# Patient Record
Sex: Female | Born: 1975 | Race: Black or African American | Hispanic: No | Marital: Single | Smoking: Never smoker
Health system: Southern US, Community
[De-identification: ages and names within clinical notes are randomized; demographics above are authoritative.]

---

## 1998-06-11 ENCOUNTER — Emergency Department (HOSPITAL_COMMUNITY): Admission: EM | Admit: 1998-06-11 | Discharge: 1998-06-11 | Payer: Self-pay

## 1998-12-20 ENCOUNTER — Emergency Department (HOSPITAL_COMMUNITY): Admission: EM | Admit: 1998-12-20 | Discharge: 1998-12-20 | Payer: Self-pay | Admitting: Emergency Medicine

## 1998-12-20 ENCOUNTER — Encounter: Payer: Self-pay | Admitting: Emergency Medicine

## 2003-07-06 ENCOUNTER — Other Ambulatory Visit: Admission: RE | Admit: 2003-07-06 | Discharge: 2003-07-06 | Payer: Self-pay | Admitting: *Deleted

## 2004-01-08 ENCOUNTER — Emergency Department (HOSPITAL_COMMUNITY): Admission: EM | Admit: 2004-01-08 | Discharge: 2004-01-08 | Payer: Self-pay | Admitting: Emergency Medicine

## 2008-02-17 ENCOUNTER — Ambulatory Visit: Payer: Self-pay | Admitting: Cardiology

## 2008-02-27 ENCOUNTER — Ambulatory Visit: Payer: Self-pay

## 2008-02-27 ENCOUNTER — Encounter: Payer: Self-pay | Admitting: Cardiology

## 2008-04-06 ENCOUNTER — Ambulatory Visit: Payer: Self-pay | Admitting: Gastroenterology

## 2008-04-06 DIAGNOSIS — R079 Chest pain, unspecified: Secondary | ICD-10-CM

## 2008-04-06 DIAGNOSIS — R1013 Epigastric pain: Secondary | ICD-10-CM

## 2008-04-06 LAB — CONVERTED CEMR LAB

## 2008-04-07 LAB — CONVERTED CEMR LAB
ALT: 17 units/L (ref 0–35)
AST: 17 units/L (ref 0–37)
Albumin: 3.9 g/dL (ref 3.5–5.2)
Alkaline Phosphatase: 40 units/L (ref 39–117)
BUN: 15 mg/dL (ref 6–23)
Basophils Absolute: 0 10*3/uL (ref 0.0–0.1)
Basophils Relative: 0.2 % (ref 0.0–3.0)
CO2: 29 meq/L (ref 19–32)
Calcium: 9.5 mg/dL (ref 8.4–10.5)
Chloride: 105 meq/L (ref 96–112)
Creatinine, Ser: 0.9 mg/dL (ref 0.4–1.2)
Eosinophils Absolute: 0 10*3/uL (ref 0.0–0.7)
Eosinophils Relative: 0.9 % (ref 0.0–5.0)
GFR calc Af Amer: 95 mL/min
GFR calc non Af Amer: 79 mL/min
Glucose, Bld: 78 mg/dL (ref 70–99)
HCT: 40.7 % (ref 36.0–46.0)
Hemoglobin: 13.8 g/dL (ref 12.0–15.0)
Lymphocytes Relative: 26.1 % (ref 12.0–46.0)
MCHC: 34 g/dL (ref 30.0–36.0)
MCV: 85.3 fL (ref 78.0–100.0)
Monocytes Absolute: 0.3 10*3/uL (ref 0.1–1.0)
Monocytes Relative: 6.9 % (ref 3.0–12.0)
Neutro Abs: 3 10*3/uL (ref 1.4–7.7)
Neutrophils Relative %: 65.9 % (ref 43.0–77.0)
Platelets: 137 10*3/uL — ABNORMAL LOW (ref 150–400)
Potassium: 4.4 meq/L (ref 3.5–5.1)
RBC: 4.77 M/uL (ref 3.87–5.11)
RDW: 12.2 % (ref 11.5–14.6)
Sodium: 139 meq/L (ref 135–145)
TSH: 0.98 microintl units/mL (ref 0.35–5.50)
Total Bilirubin: 0.7 mg/dL (ref 0.3–1.2)
Total Protein: 7.3 g/dL (ref 6.0–8.3)
WBC: 4.4 10*3/uL — ABNORMAL LOW (ref 4.5–10.5)

## 2008-04-13 ENCOUNTER — Ambulatory Visit: Payer: Self-pay | Admitting: Gastroenterology

## 2008-04-13 ENCOUNTER — Ambulatory Visit (HOSPITAL_COMMUNITY): Admission: RE | Admit: 2008-04-13 | Discharge: 2008-04-13 | Payer: Self-pay | Admitting: Gastroenterology

## 2008-04-13 ENCOUNTER — Encounter: Payer: Self-pay | Admitting: Gastroenterology

## 2008-04-14 ENCOUNTER — Telehealth: Payer: Self-pay | Admitting: Gastroenterology

## 2008-04-18 ENCOUNTER — Encounter: Payer: Self-pay | Admitting: Gastroenterology

## 2008-04-19 ENCOUNTER — Encounter: Payer: Self-pay | Admitting: Gastroenterology

## 2008-12-08 IMAGING — US US ABDOMEN COMPLETE
1 series · 14 of 25 positions shown · non-contrast
Comparison: CT 01/08/2004

CLINICAL DATA: Abdominal pain

ABDOMEN ULTRASOUND
TECHNIQUE: Complete abdominal ultrasound examination was performed
including evaluation of the liver, gallbladder, bile ducts,
pancreas, kidneys, spleen, IVC, and abdominal aorta.

[Series 1: unknown · 0.32mm/px · 14 of 77 slices shown]
[im 1/77]
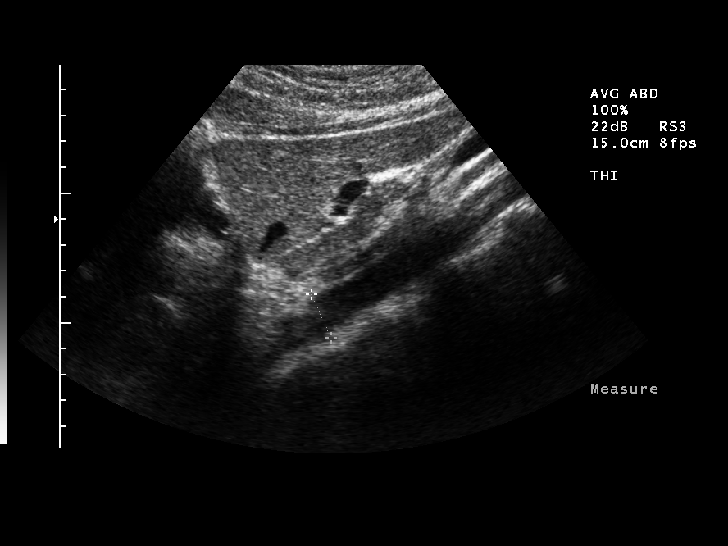
[im 7/77]
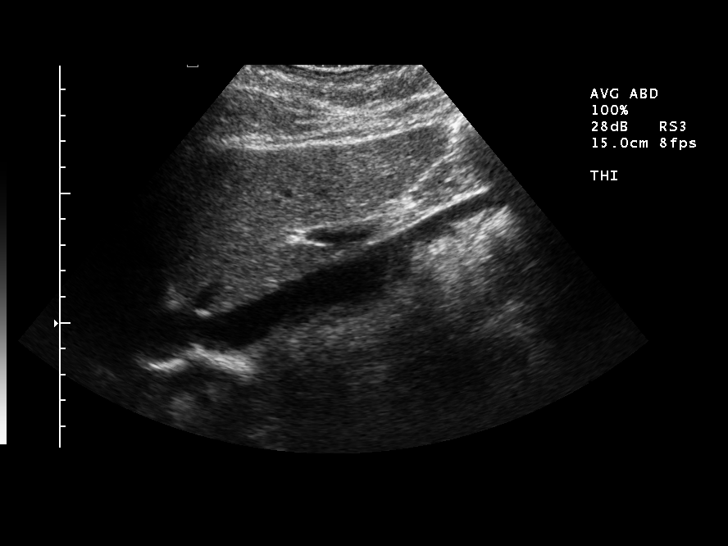
[im 13/77]
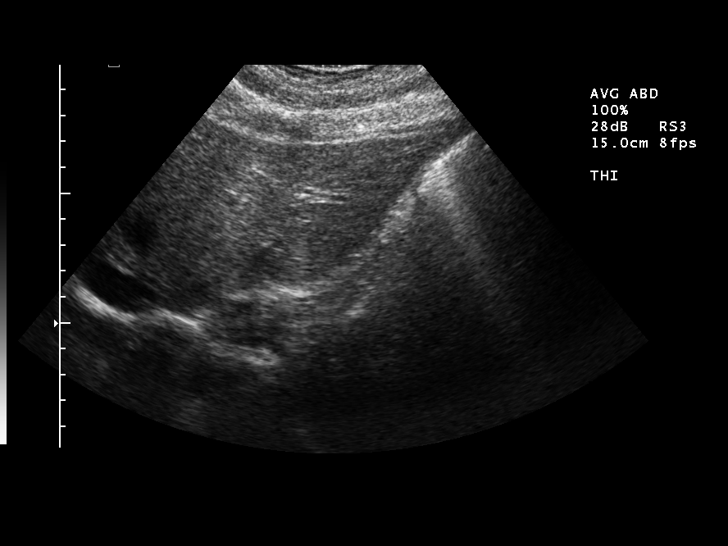
[im 20/77]
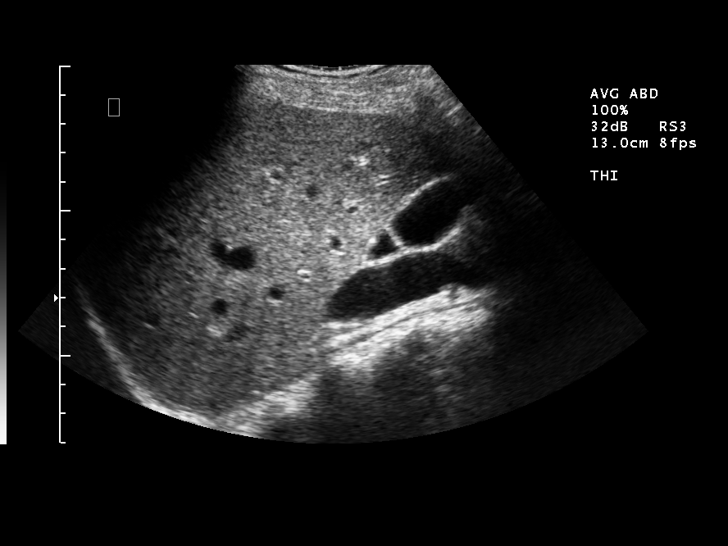
[im 26/77]
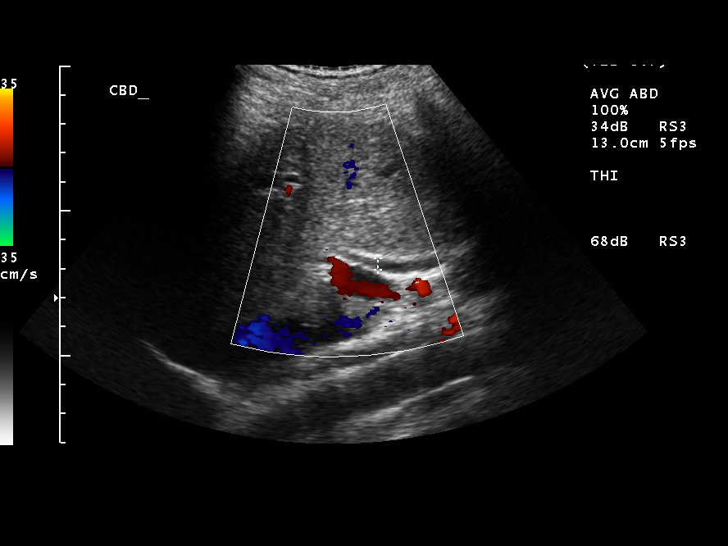
[im 29/77]
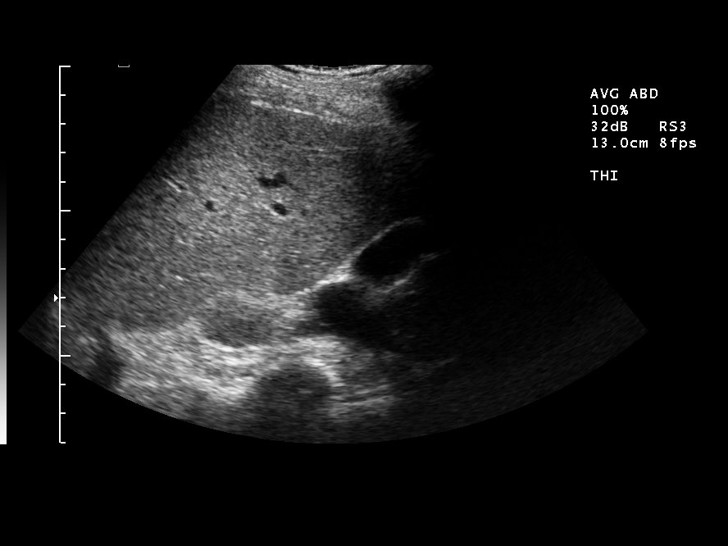
[im 35/77]
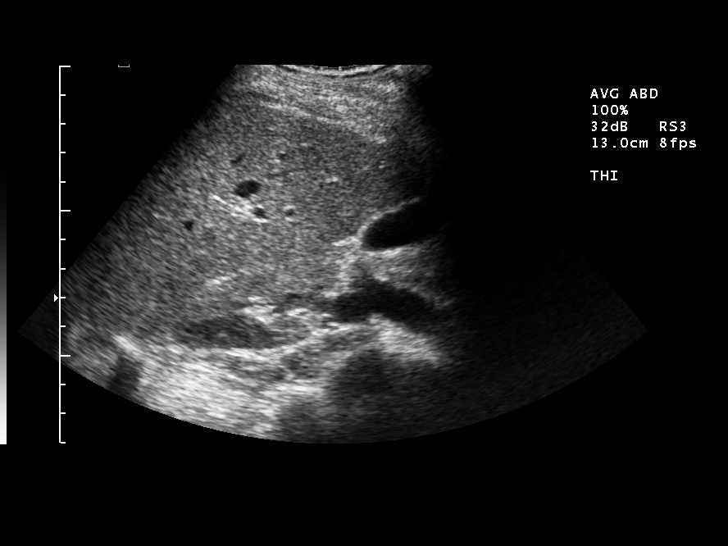
[im 42/77]
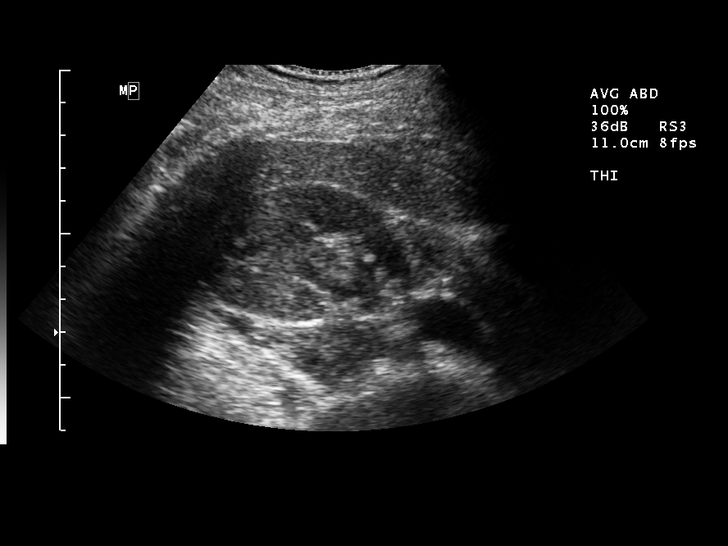
[im 48/77]
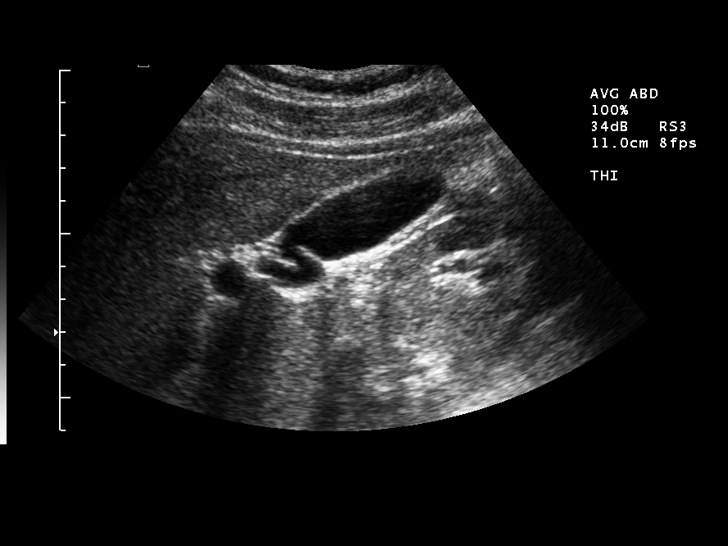
[im 51/77]
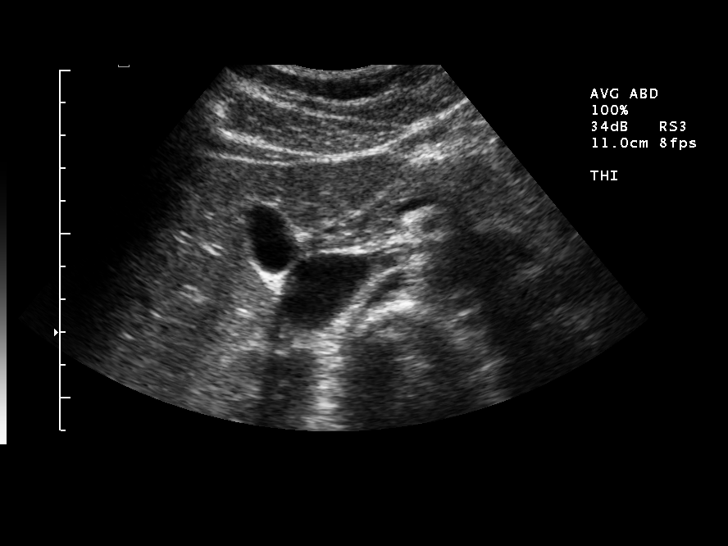
[im 58/77]
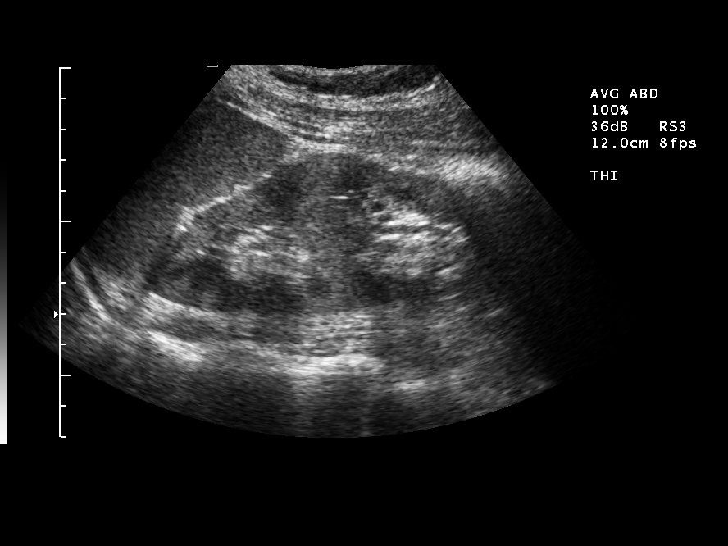
[im 64/77]
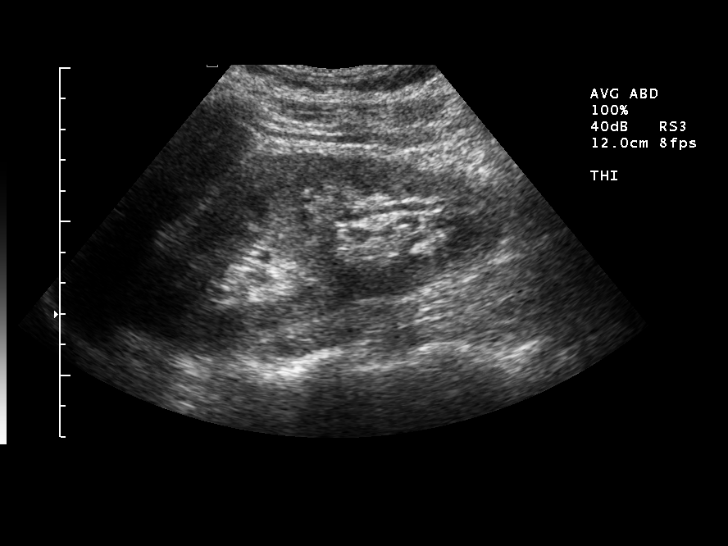
[im 70/77]
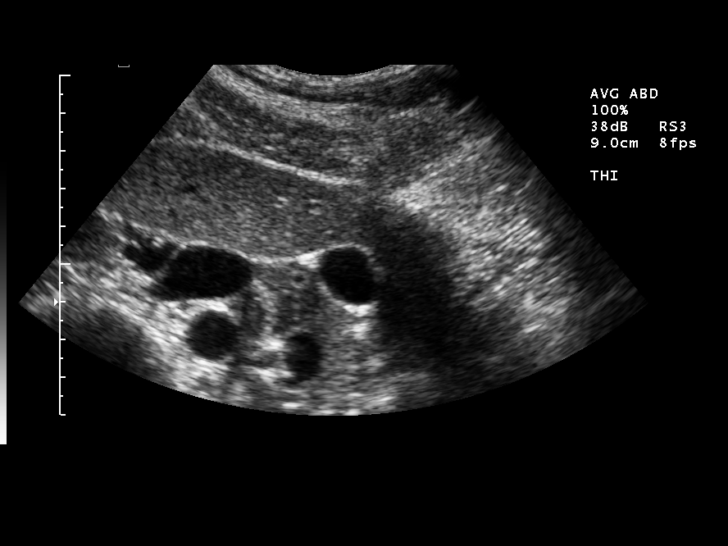
[im 77/77]
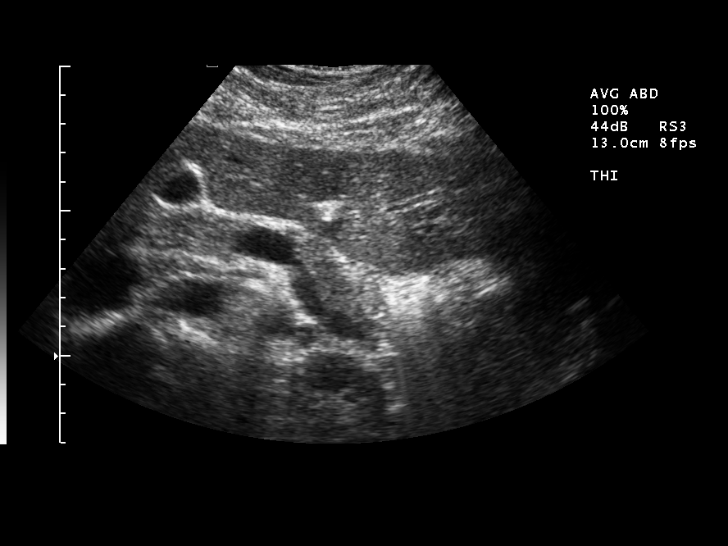

[14 of 25 positions shown; findings below may reference images not displayed]

FINDINGS: Gallbladder:  No gallstones.  No gallbladder wall thickening or
pericholecystic fluid. Negative sonographic Murphy's sign per the
ultrasound technologist.

Common bile duct: Normal in caliber.

Liver:  Normal size and echotexture.  No focal parenchymal
abnormalities.

Inferior vena cava:  Patent.

Pancreas:  Visualized portions unremarkable.

Spleen:  Normal size and echotexture without focal parenchymal
abnormalities.

Right kidney:  No hydronephrosis.   Normal parenchymal echotexture
without focal abnormalities.

Left kidney:  No hydronephrosis. Normal parenchymal echotexture
without focal abnormalities.

Abdominal aorta:  Visualized portions normal in caliber,
unremarkable.
IMPRESSION: Normal abdominal ultrasound.

## 2010-06-03 ENCOUNTER — Encounter: Payer: Self-pay | Admitting: Internal Medicine

## 2010-09-26 NOTE — Assessment & Plan Note (Signed)
[facility]                            CARDIOLOGY OFFICE NOTE   NAME:Latner, ANAVEY COOMBES                   MRN:          [id number]  DATE:02/17/2008                            DOB:          [birth year]    REASON FOR REFERRAL:  Evaluate the patient with chest pain.   HISTORY OF PRESENT ILLNESS:  The patient is pleasant 35 year old [place]  American female without prior cardiac history.  She says since January,  she has been getting chest discomfort.  However, this has been  increasing in frequency and intensity.  It happens now daily or every  other day.  She describes a sharp discomfort under her left breast.  It  may last for a few to several minutes.  It seems to come on with  emotional stress such as if she gets an upsetting phone call.  She has  had a lot of family stress.  It may be 8/10 in intensity.  She might get  lightheaded.  It may hurt to take a deep breath when this happens.  She  does not describe any nausea, vomiting, or diaphoresis.  She does not  describe any radiation to her jaw or to her arms.  She is able to be  very athletic doing pullout.  She has been walking briskly 3-4 miles a  day without bringing on the symptoms.  She does not describe any PND or  orthopnea or symptoms when she is very comfortable.   PAST MEDICAL HISTORY:  She has no history of hypertension, diabetes or  hyperlipidemia.   PAST SURGICAL HISTORY:  None.   ALLERGIES:  None.   MEDICATIONS:  None.   SOCIAL HISTORY:  The patient is not married but she is here with her  boyfriend.  She has no children.  She does not smoke cigarettes.  She  drinks alcohol, drinking wine daily.  She does have a stressful job.   FAMILY HISTORY:  Noncontributory for early coronary artery disease.   REVIEW OF SYSTEMS:  As stated in the HPI and positive for some recent  weight gain.  Negative for all other systems.   PHYSICAL EXAMINATION:  GENERAL:  The patient is pleasant and  in no  distress.  VITAL SIGNS:  Pulse 86 and regular, weight 159 pounds.  HEENT:  Eyelids are unremarkable, pupils are equal, round and reactive  to light; some fundi within normal limits; oral mucosa normal.  NECK:  No jugular distention at 45 degrees; carotid upstroke brisk and  symmetric.  No bruits, no thyromegaly.  LYMPHATICS:  No cervical, axillary, or inguinal.  LUNGS:  Clear to auscultation bilaterally.  BACK:  No costovertebral angle tenderness.  CHEST:  Unremarkable.  HEART:  PMI not displaced or sustained; S1and S2 within normal limits.  No S3, no S4, no clicks, no rubs, no murmurs.  ABDOMEN:  Flat, positive bowel sounds normal in frequency and pitch; no  bruits, no rebound, no guarding or midline pulsatile mass.  No  hepatomegaly, no splenomegaly.  SKIN:  No rashes, no nodules.  EXTREMITIES:  2+ pulses throughout; no edema, no cyanosis, no clubbing.  NEURO:  Oriented to person, place, and time.  Cranial nerves II-XII are  grossly intact.  Motor grossly intact.   EKG sinus rhythm, rate 86, rightward axis, intervals within normal  limits, questionable left atrial enlargement, no acute ST-T wave  changes.   ASSESSMENT AND PLAN:  1. Chest, the patient's chest discomfort is atypical.  The only      marginally abnormal cardiac finding is her EKG which has a      rightward axis, questionable left atrial enlargement.  I am going      to check an echocardiogram to make sure she does not have any      structural cardiac abnormalities.  If this is normal, I would not      suggest further cardiovascular testing.  She is not describing      palpitations so I do not think a rhythm monitor would be helpful.      She is able to exercise vigorously without bringing on any these      symptoms so I do not think a stress test would be warranted.      Rather, if the echo is normal, I would suggest evaluation from a      gastroenterologist for possible GI source such as peptic ulcer.  Of       note, her primary care Vaneta Hammontree suggested that she see a      psychiatrist and she might be having anxiety.  This may well be the      case.  She has a lot of stress at work and in life.  I have      encouraged her to continue to think about this and follow up with      her primary care doctor.  2. Follow up will be based on the above or future symptoms.     Rollene Rotunda, MD, [facility]  Electronically Signed    JH/MedQ  DD: 02/17/2008  DT: 02/18/2008  Job #: [id number]

## 2015-04-01 ENCOUNTER — Encounter: Payer: Self-pay | Admitting: Gastroenterology

## 2020-02-12 LAB — HM PAP SMEAR

## 2021-05-22 ENCOUNTER — Encounter: Payer: Self-pay | Admitting: Nurse Practitioner

## 2021-05-22 ENCOUNTER — Ambulatory Visit: Payer: Managed Care, Other (non HMO) | Admitting: Nurse Practitioner

## 2021-05-22 ENCOUNTER — Other Ambulatory Visit: Payer: Self-pay

## 2021-05-22 VITALS — BP 122/80 | HR 103 | Temp 98.8°F | Ht 64.4 in | Wt 215.4 lb

## 2021-05-22 DIAGNOSIS — Z0000 Encounter for general adult medical examination without abnormal findings: Secondary | ICD-10-CM

## 2021-05-22 DIAGNOSIS — Z6836 Body mass index (BMI) 36.0-36.9, adult: Secondary | ICD-10-CM

## 2021-05-22 DIAGNOSIS — Z1159 Encounter for screening for other viral diseases: Secondary | ICD-10-CM | POA: Diagnosis not present

## 2021-05-22 DIAGNOSIS — Z1231 Encounter for screening mammogram for malignant neoplasm of breast: Secondary | ICD-10-CM

## 2021-05-22 DIAGNOSIS — Z2821 Immunization not carried out because of patient refusal: Secondary | ICD-10-CM

## 2021-05-22 DIAGNOSIS — E6609 Other obesity due to excess calories: Secondary | ICD-10-CM | POA: Diagnosis not present

## 2021-05-22 DIAGNOSIS — Z114 Encounter for screening for human immunodeficiency virus [HIV]: Secondary | ICD-10-CM

## 2021-05-22 DIAGNOSIS — Z7689 Persons encountering health services in other specified circumstances: Secondary | ICD-10-CM

## 2021-05-22 DIAGNOSIS — Z01419 Encounter for gynecological examination (general) (routine) without abnormal findings: Secondary | ICD-10-CM

## 2021-05-22 DIAGNOSIS — Z13228 Encounter for screening for other metabolic disorders: Secondary | ICD-10-CM

## 2021-05-22 NOTE — Patient Instructions (Addendum)
Health Maintenance, Female °Adopting a healthy lifestyle and getting preventive care are important in promoting health and wellness. Ask your health care provider about: °The right schedule for you to have regular tests and exams. °Things you can do on your own to prevent diseases and keep yourself healthy. °What should I know about diet, weight, and exercise? °Eat a healthy diet ° °Eat a diet that includes plenty of vegetables, fruits, low-fat dairy products, and lean protein. °Do not eat a lot of foods that are high in solid fats, added sugars, or sodium. °Maintain a healthy weight °Body mass index (BMI) is used to identify weight problems. It estimates body fat based on height and weight. Your health care provider can help determine your BMI and help you achieve or maintain a healthy weight. °Get regular exercise °Get regular exercise. This is one of the most important things you can do for your health. Most adults should: °Exercise for at least 150 minutes each week. The exercise should increase your heart rate and make you sweat (moderate-intensity exercise). °Do strengthening exercises at least twice a week. This is in addition to the moderate-intensity exercise. °Spend less time sitting. Even light physical activity can be beneficial. °Watch cholesterol and blood lipids °Have your blood tested for lipids and cholesterol at 44 years of age, then have this test every 5 years. °Have your cholesterol levels checked more often if: °Your lipid or cholesterol levels are high. °You are older than 44 years of age. °You are at high risk for heart disease. °What should I know about cancer screening? °Depending on your health history and family history, you may need to have cancer screening at various ages. This may include screening for: °Breast cancer. °Cervical cancer. °Colorectal cancer. °Skin cancer. °Lung cancer. °What should I know about heart disease, diabetes, and high blood pressure? °Blood pressure and heart  disease °High blood pressure causes heart disease and increases the risk of stroke. This is more likely to develop in people who have high blood pressure readings or are overweight. °Have your blood pressure checked: °Every 3-5 years if you are 18-39 years of age. °Every year if you are 40 years old or older. °Diabetes °Have regular diabetes screenings. This checks your fasting blood sugar level. Have the screening done: °Once every three years after age 40 if you are at a normal weight and have a low risk for diabetes. °More often and at a younger age if you are overweight or have a high risk for diabetes. °What should I know about preventing infection? °Hepatitis B °If you have a higher risk for hepatitis B, you should be screened for this virus. Talk with your health care provider to find out if you are at risk for hepatitis B infection. °Hepatitis C °Testing is recommended for: °Everyone born from 1945 through 1965. °Anyone with known risk factors for hepatitis C. °Sexually transmitted infections (STIs) °Get screened for STIs, including gonorrhea and chlamydia, if: °You are sexually active and are younger than 44 years of age. °You are older than 44 years of age and your health care provider tells you that you are at risk for this type of infection. °Your sexual activity has changed since you were last screened, and you are at increased risk for chlamydia or gonorrhea. Ask your health care provider if you are at risk. °Ask your health care provider about whether you are at high risk for HIV. Your health care provider may recommend a prescription medicine to help prevent HIV   infection. If you choose to take medicine to prevent HIV, you should first get tested for HIV. You should then be tested every 3 months for as long as you are taking the medicine. Pregnancy If you are about to stop having your period (premenopausal) and you may become pregnant, seek counseling before you get pregnant. Take 400 to 800  micrograms (mcg) of folic acid every day if you become pregnant. Ask for birth control (contraception) if you want to prevent pregnancy. Osteoporosis and menopause Osteoporosis is a disease in which the bones lose minerals and strength with aging. This can result in bone fractures. If you are 47 years old or older, or if you are at risk for osteoporosis and fractures, ask your health care provider if you should: Be screened for bone loss. Take a calcium or vitamin D supplement to lower your risk of fractures. Be given hormone replacement therapy (HRT) to treat symptoms of menopause. Follow these instructions at home: Alcohol use Do not drink alcohol if: Your health care provider tells you not to drink. You are pregnant, may be pregnant, or are planning to become pregnant. If you drink alcohol: Limit how much you have to: 0-1 drink a day. Know how much alcohol is in your drink. In the [place] one drink equals one 12 oz bottle of beer (355 mL), one 5 oz glass of wine (148 mL), or one 1 oz glass of hard liquor (44 mL). Lifestyle Do not use any products that contain nicotine or tobacco. These products include cigarettes, chewing tobacco, and vaping devices, such as e-cigarettes. If you need help quitting, ask your health care provider. Do not use street drugs. Do not share needles. Ask your health care provider for help if you need support or information about quitting drugs. General instructions Schedule regular health, dental, and eye exams. Stay current with your vaccines. Tell your health care provider if: You often feel depressed. You have ever been abused or do not feel safe at home. Summary Adopting a healthy lifestyle and getting preventive care are important in promoting health and wellness. Follow your health care provider's instructions about healthy diet, exercising, and getting tested or screened for diseases. Follow your health care provider's instructions on monitoring your  cholesterol and blood pressure. This information is not intended to replace advice given to you by your health care provider. Make sure you discuss any questions you have with your health care provider. Document Revised: 09/19/2020 Document Reviewed: 09/19/2020 Elsevier Patient Education  2022 [employer]  Exercising to Lose Weight Getting regular exercise is important for everyone. It is especially important if you are overweight. Being overweight increases your risk of heart disease, stroke, diabetes, high blood pressure, and several types of cancer. Exercising, and reducing the calories you consume, can help you lose weight and improve fitness and health. Exercise can be moderate or vigorous intensity. To lose weight, most people need to do a certain amount of moderate or vigorous-intensity exercise each week. How can exercise affect me? You lose weight when you exercise enough to burn more calories than you eat. Exercise also reduces body fat and builds muscle. The more muscle you have, the more calories you burn. Exercise also: Improves mood. Reduces stress and tension. Improves your overall fitness, flexibility, and endurance. Increases bone strength. Moderate-intensity exercise Moderate-intensity exercise is any activity that gets you moving enough to burn at least three times more energy (calories) than if you were sitting. Examples of moderate exercise include: Walking a mile  in 15 minutes. Doing light yard work. Biking at an easy pace. Most people should get at least 150 minutes of moderate-intensity exercise a week to maintain their body weight. Vigorous-intensity exercise Vigorous-intensity exercise is any activity that gets you moving enough to burn at least six times more calories than if you were sitting. When you exercise at this intensity, you should be working hard enough that you are not able to carry on a conversation. Examples of vigorous exercise  include: Running. Playing a team sport, such as football, basketball, and soccer. Jumping rope. Most people should get at least 75 minutes a week of vigorous exercise to maintain their body weight. What actions can I take to lose weight? The amount of exercise you need to lose weight depends on: Your age. The type of exercise. Any health conditions you have. Your overall physical ability. Talk to your health care provider about how much exercise you need and what types of activities are safe for you. Nutrition  Make changes to your diet as told by your health care provider or diet and nutrition specialist (dietitian). This may include: Eating fewer calories. Eating more protein. Eating less unhealthy fats. Eating a diet that includes fresh fruits and vegetables, whole grains, low-fat dairy products, and lean protein. Avoiding foods with added fat, salt, and sugar. Drink plenty of water while you exercise to prevent dehydration or heat stroke. Activity Choose an activity that you enjoy and set realistic goals. Your health care provider can help you make an exercise plan that works for you. Exercise at a moderate or vigorous intensity most days of the week. The intensity of exercise may vary from person to person. You can tell how intense a workout is for you by paying attention to your breathing and heartbeat. Most people will notice their breathing and heartbeat get faster with more intense exercise. Do resistance training twice each week, such as: Push-ups. Sit-ups. Lifting weights. Using resistance bands. Getting short amounts of exercise can be just as helpful as long, structured periods of exercise. If you have trouble finding time to exercise, try doing these things as part of your daily routine: Get up, stretch, and walk around every 30 minutes throughout the day. Go for a walk during your lunch break. Park your car farther away from your destination. If you take public  transportation, get off one stop early and walk the rest of the way. Make phone calls while standing up and walking around. Take the stairs instead of elevators or escalators. Wear comfortable clothes and shoes with good support. Do not exercise so much that you hurt yourself, feel dizzy, or get very short of breath. Where to find more information [place]. Department of Health and Human Services: ThisPath.fi Centers for Disease Control and Prevention: [employer]Exhibition.com.br Contact a health care provider: Before starting a new exercise program. If you have questions or concerns about your weight. If you have a medical problem that keeps you from exercising. Get help right away if: You have any of the following while exercising: Injury. Dizziness. Difficulty breathing or shortness of breath that does not go away when you stop exercising. Chest pain. Rapid heartbeat. These symptoms may represent a serious problem that is an emergency. Do not wait to see if the symptoms will go away. Get medical help right away. Call your local emergency services (911 in the [place] Do not drive yourself to the hospital. Summary Getting regular exercise is especially important if you are overweight. Being overweight increases your  risk of heart disease, stroke, diabetes, high blood pressure, and several types of cancer. Losing weight happens when you burn more calories than you eat. Reducing the amount of calories you eat, and getting regular moderate or vigorous exercise each week, helps you lose weight. This information is not intended to replace advice given to you by your health care provider. Make sure you discuss any questions you have with your health care provider. Document Revised: 06/26/2020 Document Reviewed: 06/26/2020 Elsevier Patient Education  2022 [employer].

## 2021-05-22 NOTE — Progress Notes (Signed)
I,Haley Franco,acting as a Education administrator for [employer], FNP.,have documented all relevant documentation on the behalf of Haley Brine, FNP,as directed by  Haley Brine, FNP while in the presence of Haley Franco, [place].   This visit occurred during the SARS-CoV-2 public health emergency.  Safety protocols were in place, including screening questions prior to the visit, additional usage of staff PPE, and extensive cleaning of exam room while observing appropriate contact time as indicated for disinfecting solutions.  Subjective:     Patient ID: Haley Franco , female    DOB: August 20, 1975 , 46 y.o.   MRN: [id number]   Chief Complaint  Patient presents with   Annual Exam   Establish Care    HPI  Patient presents today to establish primary care. She had been seen by Dr. Karlton Lemon years ago and relocated to [facility] after getting married, she is now divorced.  She relocated back to [facility] in October 2022, no children. 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 She does feel stressed since being in school. She is in the Dissertation phase. She would like to have a physical done. She has no concerns at this time.  She had an [facility] in [place], [place] - last PAP 2021 - normal She has gained a lot of weight. She is going to the Bariatric clinic less than 30 days ago - cut her calories to 1500-1600 a day, encouraged physical movement no less than 2 times a week. She is getting the lipo B injection. She is ordering [employer].   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       History reviewed. No pertinent past medical history.   Family History  Problem Relation Age of Onset   Healthy Mother    Insomnia Mother    Healthy Father    Dementia Maternal Grandmother    Alzheimer's disease Maternal Grandmother     Hypertension Maternal Grandfather    Hyperlipidemia Maternal Grandfather     No current outpatient medications on file.   No Known Allergies    The patient states she uses none for birth control.  Patient's last menstrual period was 05/19/2021.. Negative for Dysmenorrhea and Negative for Menorrhagia. Negative for: breast discharge, breast lump(s), breast pain and breast self exam. Associated symptoms include abnormal vaginal bleeding. Pertinent negatives include abnormal bleeding (hematology), anxiety, decreased libido, depression, difficulty falling sleep, dyspareunia, history of infertility, nocturia, sexual dysfunction, sleep disturbances, urinary incontinence, urinary urgency, vaginal discharge and vaginal itching. Diet regular.The patient states her exercise level is minimal.   The patient's tobacco use is:  Social History   Tobacco Use  Smoking Status Never   Passive exposure: Never  Smokeless Tobacco Never   She has been exposed to passive smoke. The patient's alcohol use is:  Social History   Substance and Sexual Activity  Alcohol Use Yes   Comment: socially    Additional information: Last pap 2021 per patient, next one scheduled for 2024.    Review of Systems  Constitutional: Negative.   HENT: Negative.    Eyes: Negative.   Respiratory: Negative.    Cardiovascular: Negative.   Gastrointestinal: Negative.   Endocrine: Negative.   Genitourinary: Negative.   Musculoskeletal: Negative.   Skin: Negative.   Allergic/Immunologic: Negative.   Neurological: Negative.   Hematological:  Negative.   Psychiatric/Behavioral: Negative.      Today's Vitals   05/22/21 1157  BP: 122/80  Pulse: (!) 103  Temp: 98.8 F (37.1 C)  Weight: 215 lb 6.4 oz (97.7 kg)  Height: 5' 4.4" (1.636 m)  PainSc: 0-No pain   Body mass index is 36.52 kg/m.   Objective:  Physical Exam Constitutional:      General: She is not in acute distress.    Appearance: Normal appearance. She is  well-developed. She is obese.  HENT:     Head: Normocephalic and atraumatic.     Right Ear: Hearing, tympanic membrane, ear canal and external ear normal. There is no impacted cerumen.     Left Ear: Hearing, tympanic membrane, ear canal and external ear normal. There is no impacted cerumen.     Nose:     Comments: Deferred - masked    Mouth/Throat:     Comments: Deferred - masked Eyes:     General: Lids are normal.     Extraocular Movements: Extraocular movements intact.     Conjunctiva/sclera: Conjunctivae normal.     Pupils: Pupils are equal, round, and reactive to light.     Funduscopic exam:    Right eye: No papilledema.        Left eye: No papilledema.  Neck:     Thyroid: No thyroid mass.     Vascular: No carotid bruit.  Cardiovascular:     Rate and Rhythm: Normal rate and regular rhythm.     Pulses: Normal pulses.     Heart sounds: Normal heart sounds. No murmur heard. Pulmonary:     Effort: Pulmonary effort is normal.     Breath sounds: Normal breath sounds.  Chest:     Chest wall: No mass.  Breasts:    Tanner Score is 5.     Right: Normal. No mass or tenderness.     Left: Normal. No mass or tenderness.  Abdominal:     General: Abdomen is flat. Bowel sounds are normal. There is no distension.     Palpations: Abdomen is soft.     Tenderness: There is no abdominal tenderness.  Genitourinary:    Rectum: Guaiac result negative.  Musculoskeletal:        General: No swelling. Normal range of motion.     Cervical back: Full passive range of motion without pain, normal range of motion and neck supple.     Right lower leg: No edema.     Left lower leg: No edema.  Lymphadenopathy:     Upper Body:     Right upper body: No supraclavicular, axillary or pectoral adenopathy.     Left upper body: No supraclavicular, axillary or pectoral adenopathy.  Skin:    General: Skin is warm and dry.     Capillary Refill: Capillary refill takes less than 2 seconds.  Neurological:      General: No focal deficit present.     Mental Status: She is alert and oriented to person, place, and time.     Cranial Nerves: No cranial nerve deficit.     Sensory: No sensory deficit.  Psychiatric:        Mood and Affect: Mood normal.        Behavior: Behavior normal.        Thought Content: Thought content normal.        Judgment: Judgment normal.        Assessment And Plan:     1. Encounter for general  adult medical examination w/o abnormal findings Behavior modifications discussed and diet history reviewed.   Pt will continue to exercise regularly and modify diet with low GI, plant based foods and decrease intake of processed foods.  Recommend intake of daily multivitamin, Vitamin D, and calcium.  Recommend mammogram (order placed) for preventive screenings, as well as recommend immunizations that include influenza, TDAP (she will provide [place] with a copy of her tetanus)  2. Encounter for hepatitis C screening test for low risk patient Will check Hepatitis C screening due to recent recommendations to screen all adults 18 years and older - Hepatitis C antibody  3. Encounter for screening for human immunodeficiency virus (HIV) - HIV antibody (with reflex)  4. Influenza vaccination declined Patient declined influenza vaccination at this time. Patient is aware that influenza vaccine prevents illness in 70% of healthy people, and reduces hospitalizations to 30-70% in elderly. This vaccine is recommended annually. Pt is willing to accept risk associated with refusing vaccination.  5. Class 2 obesity due to excess calories with body mass index (BMI) of 36.0 to 36.9 in adult, unspecified whether serious comorbidity present Chronic Discussed healthy diet and regular exercise options  Encouraged to exercise at least 150 minutes per week with 2 days of strength training Continue with your visits with the Bariatric clinic - Hemoglobin A1c - Insulin, random - TSH  6. Encounter for  screening for metabolic disorder - CBC - CMP14+EGFR - Lipid panel  7. Encounter for screening mammogram for breast cancer Manual breast exam is normal Will refer for mammogram - MM Digital Screening; Future  8. 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  - Ambulatory referral to Obstetrics / Gynecology  9. Establishing care with new doctor, encounter for     Patient was given opportunity to ask questions. Patient verbalized understanding of the plan and was able to repeat key elements of the plan. All questions were answered to their satisfaction.   Haley Brine, FNP   I, Haley Brine, FNP, have reviewed all documentation for this visit. The documentation on 05/22/21 for the exam, diagnosis, procedures, and orders are all accurate and complete.   THE PATIENT IS ENCOURAGED TO PRACTICE SOCIAL DISTANCING DUE TO THE COVID-19 PANDEMIC.

## 2021-05-23 LAB — CBC
Hematocrit: 41.4 % (ref 34.0–46.6)
Hemoglobin: 13.5 g/dL (ref 11.1–15.9)
MCH: 28.1 pg (ref 26.6–33.0)
MCHC: 32.6 g/dL (ref 31.5–35.7)
MCV: 86 fL (ref 79–97)
Platelets: 182 10*3/uL (ref 150–450)
RBC: 4.8 x10E6/uL (ref 3.77–5.28)
RDW: 12.5 % (ref 11.7–15.4)
WBC: 3.9 10*3/uL (ref 3.4–10.8)

## 2021-05-23 LAB — CMP14+EGFR
ALT: 20 IU/L (ref 0–32)
AST: 20 IU/L (ref 0–40)
Albumin/Globulin Ratio: 1.7 (ref 1.2–2.2)
Albumin: 4.5 g/dL (ref 3.8–4.8)
Alkaline Phosphatase: 72 IU/L (ref 44–121)
BUN/Creatinine Ratio: 8 — ABNORMAL LOW (ref 9–23)
BUN: 8 mg/dL (ref 6–24)
Bilirubin Total: 0.3 mg/dL (ref 0.0–1.2)
CO2: 24 mmol/L (ref 20–29)
Calcium: 9.6 mg/dL (ref 8.7–10.2)
Chloride: 103 mmol/L (ref 96–106)
Creatinine, Ser: 1.01 mg/dL — ABNORMAL HIGH (ref 0.57–1.00)
Globulin, Total: 2.6 g/dL (ref 1.5–4.5)
Glucose: 102 mg/dL — ABNORMAL HIGH (ref 70–99)
Potassium: 4.4 mmol/L (ref 3.5–5.2)
Sodium: 140 mmol/L (ref 134–144)
Total Protein: 7.1 g/dL (ref 6.0–8.5)
eGFR: 71 mL/min/{1.73_m2} (ref >59)

## 2021-05-23 LAB — HIV ANTIBODY (ROUTINE TESTING W REFLEX): HIV Screen 4th Generation wRfx: NONREACTIVE

## 2021-05-23 LAB — LIPID PANEL
Chol/HDL Ratio: 3.2 ratio (ref 0.0–4.4)
Cholesterol, Total: 222 mg/dL — ABNORMAL HIGH (ref 100–199)
HDL: 70 mg/dL (ref >39)
LDL Chol Calc (NIH): 138 mg/dL — ABNORMAL HIGH (ref 0–99)
Triglycerides: 79 mg/dL (ref 0–149)
VLDL Cholesterol Cal: 14 mg/dL (ref 5–40)

## 2021-05-23 LAB — HEMOGLOBIN A1C
Est. average glucose Bld gHb Est-mCnc: 111 mg/dL
Hgb A1c MFr Bld: 5.5 % (ref 4.8–5.6)

## 2021-05-23 LAB — TSH: TSH: 1.2 u[IU]/mL (ref 0.450–4.500)

## 2021-05-23 LAB — HEPATITIS C ANTIBODY: Hep C Virus Ab: <0.1 s/co ratio (ref 0.0–0.9)

## 2021-05-23 LAB — INSULIN, RANDOM: INSULIN: 10.8 u[IU]/mL (ref 2.6–24.9)

## 2021-06-08 ENCOUNTER — Ambulatory Visit
Admission: RE | Admit: 2021-06-08 | Discharge: 2021-06-08 | Disposition: A | Discharge status: Home / Self Care | Payer: Managed Care, Other (non HMO) | Source: Ambulatory Visit | Attending: Nurse Practitioner | Admitting: Nurse Practitioner

## 2021-06-08 DIAGNOSIS — Z1231 Encounter for screening mammogram for malignant neoplasm of breast: Secondary | ICD-10-CM

## 2021-11-21 ENCOUNTER — Ambulatory Visit: Payer: Managed Care, Other (non HMO) | Admitting: Nurse Practitioner

## 2021-12-25 ENCOUNTER — Ambulatory Visit: Payer: Managed Care, Other (non HMO) | Admitting: Internal Medicine

## 2021-12-25 ENCOUNTER — Encounter: Payer: Self-pay | Admitting: Internal Medicine

## 2021-12-25 VITALS — BP 124/74 | HR 78 | Temp 97.9°F | Ht 64.0 in | Wt 213.2 lb

## 2021-12-25 DIAGNOSIS — R21 Rash and other nonspecific skin eruption: Secondary | ICD-10-CM

## 2021-12-25 DIAGNOSIS — Z23 Encounter for immunization: Secondary | ICD-10-CM | POA: Diagnosis not present

## 2021-12-25 DIAGNOSIS — E7800 Pure hypercholesterolemia, unspecified: Secondary | ICD-10-CM | POA: Diagnosis not present

## 2021-12-25 DIAGNOSIS — N951 Menopausal and female climacteric states: Secondary | ICD-10-CM | POA: Diagnosis not present

## 2021-12-25 DIAGNOSIS — N926 Irregular menstruation, unspecified: Secondary | ICD-10-CM | POA: Diagnosis not present

## 2021-12-25 DIAGNOSIS — Z635 Disruption of family by separation and divorce: Secondary | ICD-10-CM

## 2021-12-25 MED ORDER — TETANUS-DIPHTH-ACELL PERTUSSIS 5-2.5-18.5 LF-MCG/0.5 IM SUSY
0.5000 mL | PREFILLED_SYRINGE | Freq: Once | INTRAMUSCULAR | Status: AC
  Administered 2021-12-25: 0.5 mL via INTRAMUSCULAR

## 2021-12-25 NOTE — Progress Notes (Signed)
Haley Franco,acting as a Neurosurgeon for Haley Aliment, MD.,have documented all relevant documentation on the behalf of Haley Aliment, MD,as directed by  Haley Aliment, MD while in the presence of Haley Aliment, MD.    Subjective:     Patient ID: Haley Franco , female    DOB: 1976-04-22 , 46 y.o.   MRN: [id number]   Chief Complaint  Patient presents with   Hyperlipidemia    HPI  Patient presents today for a cholesterol check, she is not taking any medication for this. In January, her LDL was 138. She is not yet exercising on a regular basis. She was originally scheduled to see her PCP, Lolita Cram, DNP; however, due to scheduling issues I will see her today. She made it quite clear she prefers to see JM for future visits. She has moved back to the [place] area due to recent divorce. She is also at a new job, she reports feeling overwhelmed at times due to this transition.     BP Readings from Last 3 Encounters: 12/25/21 : 124/74 05/22/21 : 122/80       History reviewed. No pertinent past medical history.   Family History  Problem Relation Age of Onset   Healthy Mother    Insomnia Mother    Healthy Father    Dementia Maternal Grandmother    Alzheimer's disease Maternal Grandmother    Hypertension Maternal Grandfather    Hyperlipidemia Maternal Grandfather     No current outpatient medications on file.   No Known Allergies   Review of Systems  Constitutional: Negative.   HENT: Negative.    Eyes: Negative.   Respiratory: Negative.    Cardiovascular: Negative.   Gastrointestinal: Negative.   Genitourinary:        She reports having irregular cycles. Last cycle was in May. Wants to know if she is in menopause.   Skin:  Positive for rash.       C/o rash on back, wants to see Derm     Today's Vitals   12/25/21 1610  BP: 124/74  Pulse: 78  Temp: 97.9 F (36.6 C)  TempSrc: Oral  Weight: 213 lb 3.2 oz (96.7 kg)  Height: 5\' 4"  (1.626 m)   Body mass  index is 36.6 kg/m.  Wt Readings from Last 3 Encounters:  12/25/21 213 lb 3.2 oz (96.7 kg)  05/22/21 215 lb 6.4 oz (97.7 kg)    Objective:  Physical Exam Vitals and nursing note reviewed.  Constitutional:      Appearance: Normal appearance.  HENT:     Head: Normocephalic and atraumatic.  Eyes:     Extraocular Movements: Extraocular movements intact.  Cardiovascular:     Rate and Rhythm: Normal rate and regular rhythm.     Heart sounds: Normal heart sounds.  Pulmonary:     Effort: Pulmonary effort is normal.     Breath sounds: Normal breath sounds.  Musculoskeletal:     Cervical back: Normal range of motion.  Skin:    General: Skin is warm.  Neurological:     General: No focal deficit present.     Mental Status: She is alert.  Psychiatric:        Mood and Affect: Mood normal.        Behavior: Behavior normal.         Assessment And Plan:     1. Hypercholesteremia Comments: She has been taking fiber pills. I suggest she f/u with Dr. 07/20/21 after  incorporating more lifestyle changes into her routine. F/u 4-6 months.   2. Irregular menses Comments: Last cycle was Spring 2023. Consider checking thyroid at next visit if still without a cycle. She is likely perimenopausal.   3. Rash Comments: Not examined, will refer her to [facility] as requested.   4. Perimenopause Comments: Encouraged to increase her intake of cruciferous veggies, consider dandelion root and ginger/turmeric teas.   5. Immunization due - Tdap (BOOSTRIX) injection 0.5 mL  6. Divorce Comments: I think she has situational anxiety; however, she does not agree with this terminology. She does agree to referral for therapy.  - Ambulatory referral to Psychology   Patient was given opportunity to ask questions. Patient verbalized understanding of the plan and was able to repeat key elements of the plan. All questions were answered to their satisfaction.   I, Haley Aliment, MD, have reviewed all documentation  for this visit. The documentation on 12/30/21 for the exam, diagnosis, procedures, and orders are all accurate and complete.   IF YOU HAVE BEEN REFERRED TO A SPECIALIST, IT MAY TAKE 1-2 WEEKS TO SCHEDULE/PROCESS THE REFERRAL. IF YOU HAVE NOT HEARD FROM US/SPECIALIST IN TWO WEEKS, PLEASE GIVE [place] A CALL AT [phone].   THE PATIENT IS ENCOURAGED TO PRACTICE SOCIAL DISTANCING DUE TO THE COVID-19 PANDEMIC.

## 2021-12-25 NOTE — Patient Instructions (Signed)

## 2022-01-11 ENCOUNTER — Other Ambulatory Visit: Payer: Managed Care, Other (non HMO)

## 2022-01-26 ENCOUNTER — Encounter: Payer: Self-pay | Admitting: Nurse Practitioner

## 2022-02-02 IMAGING — MG MM DIGITAL SCREENING BILAT W/ TOMO AND CAD
8 series · 8 of 24 positions shown · non-contrast
Comparison: None.

CLINICAL DATA: Screening.

EXAM:
DIGITAL SCREENING BILATERAL MAMMOGRAM WITH TOMOSYNTHESIS AND CAD
TECHNIQUE: Bilateral screening digital craniocaudal and mediolateral oblique
mammograms were obtained. Bilateral screening digital breast
tomosynthesis was performed. The images were evaluated with
computer-aided detection.

[R CC synth-2D]
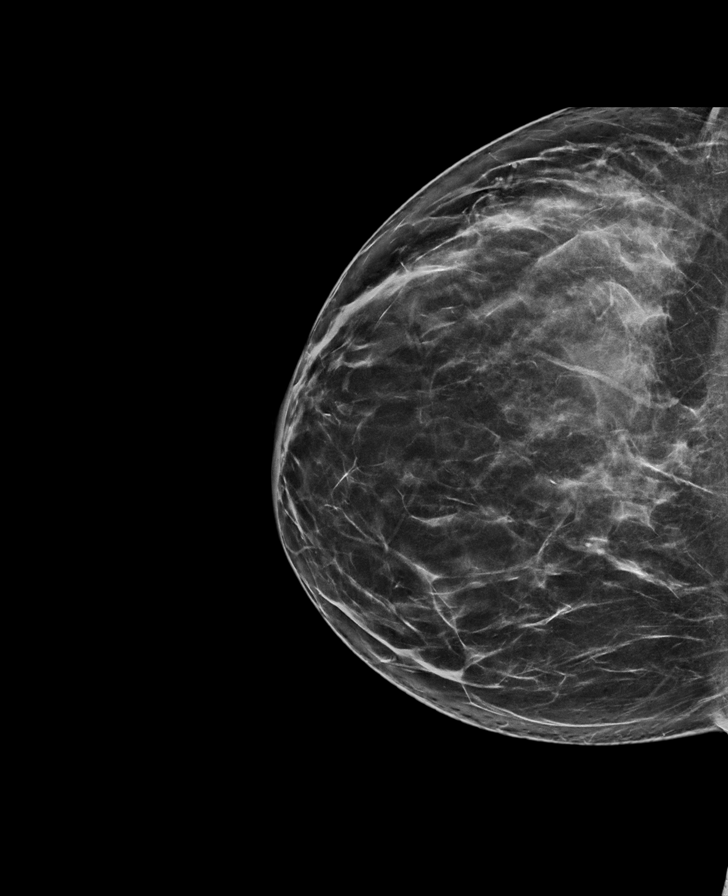

[R MLO synth-2D]
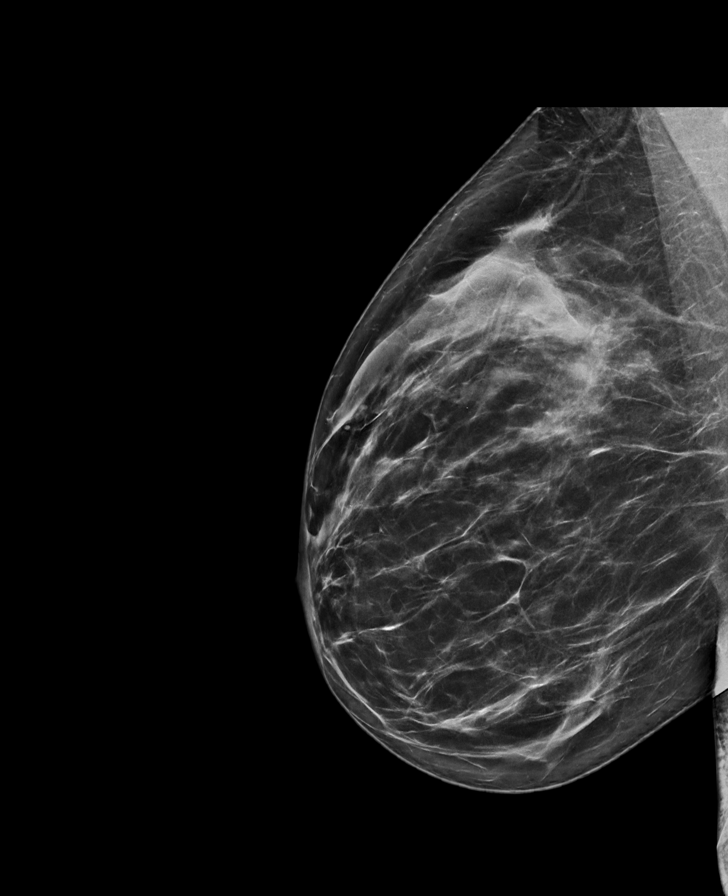

[L MLO synth-2D]
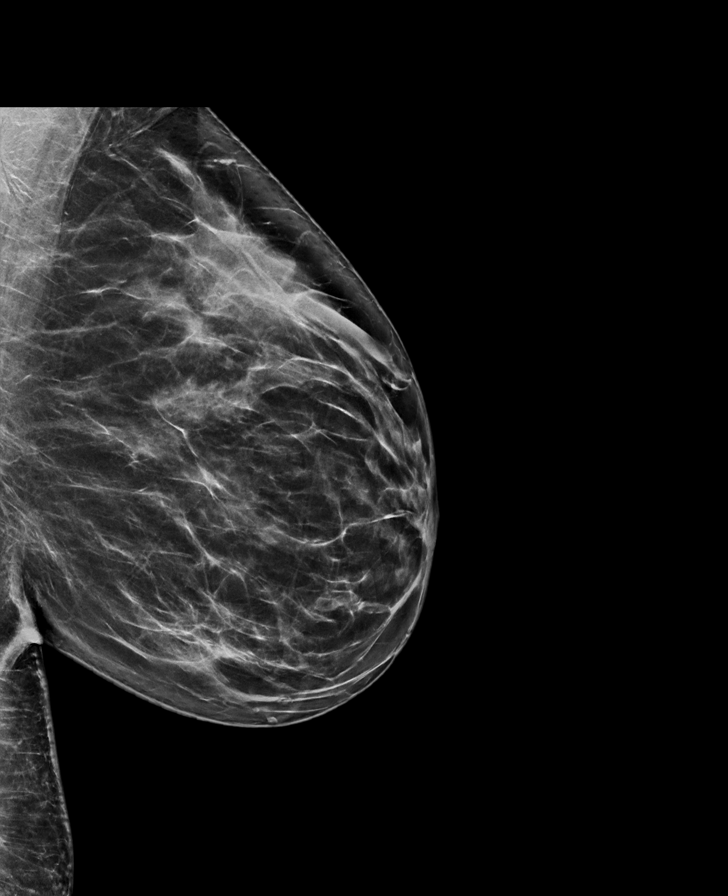

[L CC synth-2D]
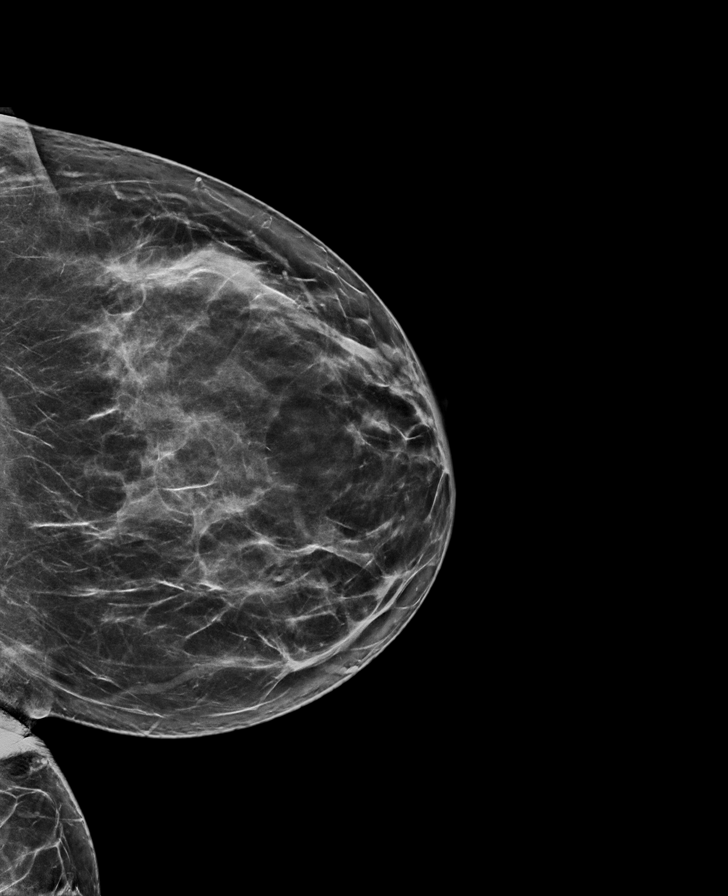

[L MLO tomo · tomo slice 37/74.0]
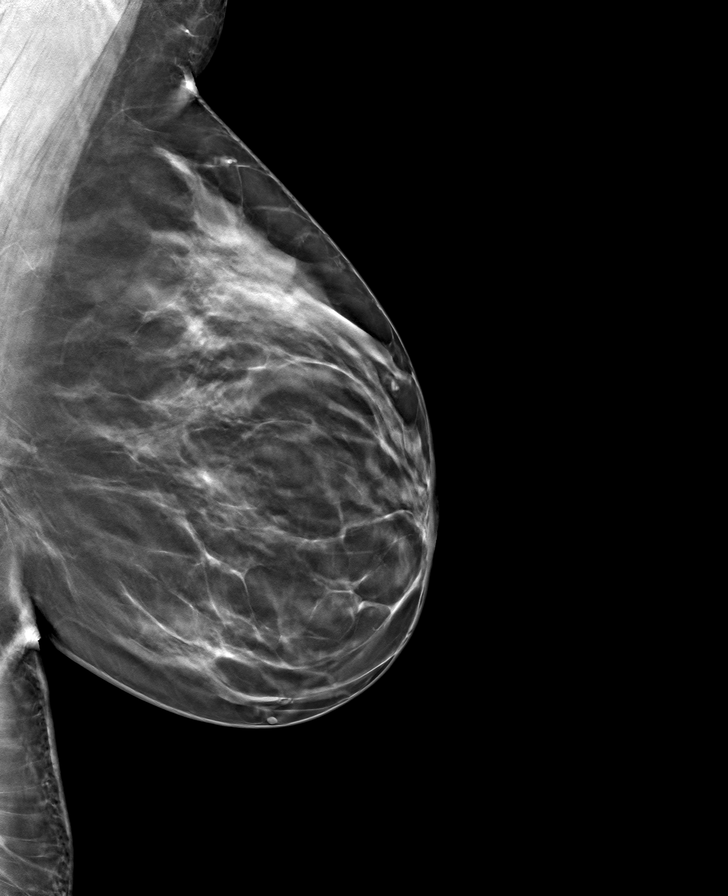

[R CC tomo · tomo slice 37/73.0]
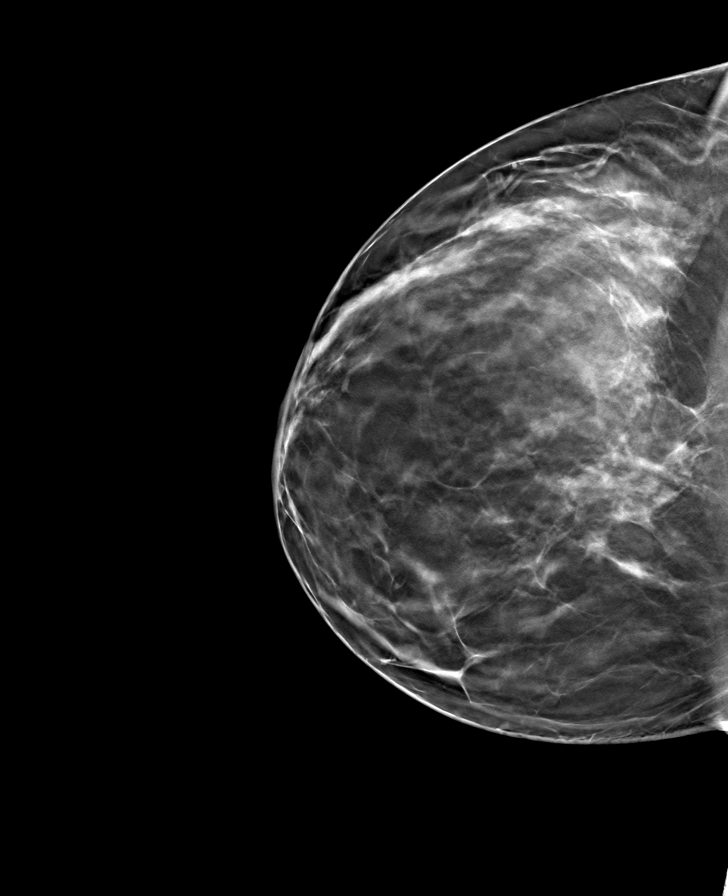

[R MLO tomo · tomo slice 41/82.0]
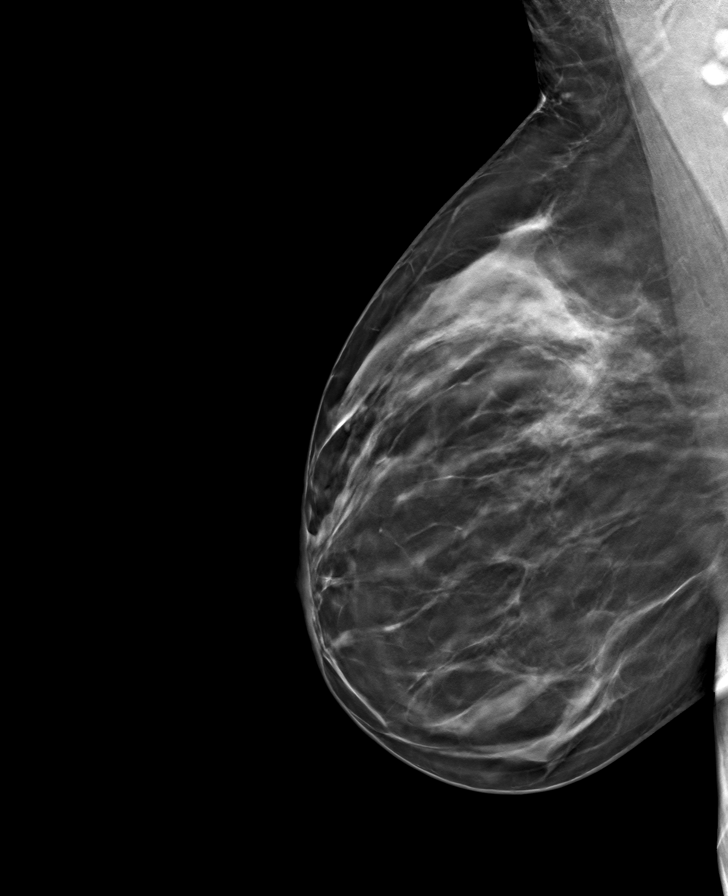

[L CC tomo · tomo slice 37/73.0]
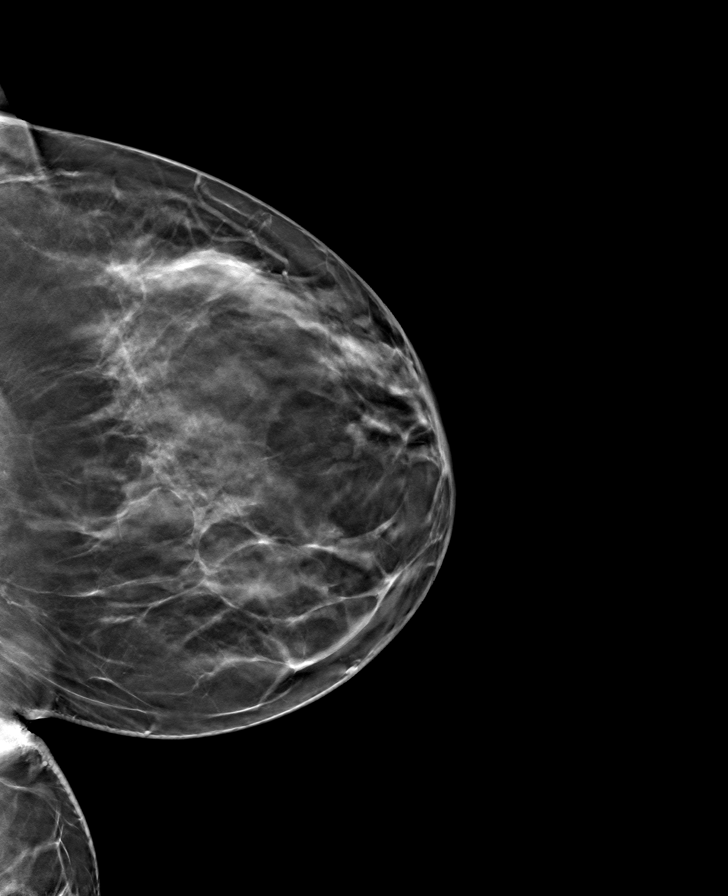

[8 of 24 positions shown; findings below may reference images not displayed]

ACR Breast Density Category c: The breast tissue is heterogeneously
dense, which may obscure small masses
FINDINGS: There are no findings suspicious for malignancy.
IMPRESSION: No mammographic evidence of malignancy. A result letter of this
screening mammogram will be mailed directly to the patient.

RECOMMENDATION:
Screening mammogram in one year. (Code:C8-T-HNK)

BI-RADS CATEGORY  1: Negative.

## 2022-05-22 ENCOUNTER — Encounter: Payer: Managed Care, Other (non HMO) | Admitting: Nurse Practitioner

## 2022-05-22 NOTE — Progress Notes (Signed)
NO SHOW

## 2022-07-02 ENCOUNTER — Ambulatory Visit: Payer: Managed Care, Other (non HMO) | Admitting: Nurse Practitioner

## 2022-07-02 DIAGNOSIS — N951 Menopausal and female climacteric states: Secondary | ICD-10-CM | POA: Insufficient documentation

## 2022-07-24 ENCOUNTER — Ambulatory Visit: Payer: Managed Care, Other (non HMO) | Admitting: Nurse Practitioner

## 2022-07-24 NOTE — Progress Notes (Deleted)
  I,Yuliana Vandrunen H Easten Maceachern,acting as a Education administrator for [name], FNP.,have documented all relevant documentation on the behalf of [name], FNP,as directed by  [name], FNP while in the presence of [name], [place].    Subjective:     Patient ID: Haley Franco , female    DOB: 30-Mar-1976 , 47 y.o.   MRN: [id number]   No chief complaint on file.   HPI  Patient presents today for cholesterol follow up.     No past medical history on file.   Family History  Problem Relation Age of Onset  . Healthy Mother   . Insomnia Mother   . Healthy Father   . Dementia Maternal Grandmother   . Alzheimer's disease Maternal Grandmother   . Hypertension Maternal Grandfather   . Hyperlipidemia Maternal Grandfather     No current outpatient medications on file.   No Known Allergies   Review of Systems   There were no vitals filed for this visit. There is no height or weight on file to calculate BMI.   Objective:  Physical Exam      Assessment And Plan:     There are no diagnoses linked to this encounter.    Patient was given opportunity to ask questions. Patient verbalized understanding of the plan and was able to repeat key elements of the plan. All questions were answered to their satisfaction.  Marrian Salvage, Medon Neftaly Inzunza, CMA, have reviewed all documentation for this visit. The documentation on 07/24/22 for the exam, diagnosis, procedures, and orders are all accurate and complete.   IF YOU HAVE BEEN REFERRED TO A SPECIALIST, IT MAY TAKE 1-2 WEEKS TO SCHEDULE/PROCESS THE REFERRAL. IF YOU HAVE NOT HEARD FROM US/SPECIALIST IN TWO WEEKS, PLEASE GIVE [place] A CALL AT [phone].   THE PATIENT IS ENCOURAGED TO PRACTICE SOCIAL DISTANCING DUE TO THE COVID-19 PANDEMIC.

## 2023-11-18 ENCOUNTER — Ambulatory Visit: Payer: Self-pay

## 2023-11-18 NOTE — Telephone Encounter (Signed)
 FYI Only or Action Required?: FYI only for provider.  Patient was last seen in primary care on 12/25/2021 by Jarold Medici, MD. Called Nurse Triage reporting No chief complaint on file.. Symptoms began several weeks ago. Interventions attempted: Nothing. Symptoms are: gradually worsening.  Triage Disposition: No disposition on file.  Patient/caregiver understands and will follow disposition?:    Copied from CRM [id number]. Topic: Clinical - Red Word Triage >> Nov 18, 2023 10:27 AM Kevelyn M wrote: Red Word that prompted transfer to Nurse Triage: Recently at the beach 2 weeks. Has had hives since then. Bought over the counter medication for rash. Has gained 7-8 pounds since then. Hands and fingers are swollen and stomach area (full/bloated). Reason for Disposition  [1] Widespread hives, itching or facial swelling AND [2] onset > 2 hours after exposure to high-risk allergen (e.g., sting, nuts, 1st dose of antibiotic)  Answer Assessment - Initial Assessment Questions 1. APPEARANCE: What does the rash look like?      Reddened, Inllamed, Raised Areas, Some Blemishes  2. LOCATION: Where is the rash located?      Chest, Leg, Face, Back of Arms  3. NUMBER: How many hives are there?      Multiple  4. SIZE: How big are the hives? (inches, cm, compare to coins) Do they all look the same or is there lots of variation in shape and size?      Pimple Sized  5. ONSET: When did the hives begin? (Hours or days ago)      2 Weeks ago  6. ITCHING: Does it itch? If Yes, ask: How bad is the itch?    - MILD: doesn't interfere with normal activities   - MODERATE-SEVERE: interferes with work, school, sleep, or other activities      Moderate to Severe  7. RECURRENT PROBLEM: Have you had hives before? If Yes, ask: When was the last time? and What happened that time?      No  8. TRIGGERS: Were you exposed to any new food, plant, cosmetic product or animal just before the hives began?      ON the beach two weeks ago  9. OTHER SYMPTOMS: Do you have any other symptoms? (e.g., fever, tongue swelling, difficulty breathing, abdomen pain)     Unexplained Weight gain, Hand Swelling, Feet Swelling  10. PREGNANCY: Is there any chance you are pregnant? When was your last menstrual period?       No, LMP 01-30-2/2  Patient states she feels like she is in a constant state of bloating and swelling.  Protocols used: Hives-A-AH

## 2023-11-20 ENCOUNTER — Ambulatory Visit: Payer: Self-pay | Admitting: Family Medicine

## 2023-11-20 ENCOUNTER — Encounter: Payer: Self-pay | Admitting: Family Medicine

## 2023-11-20 VITALS — BP 120/72 | HR 106 | Temp 98.2°F | Ht 64.0 in | Wt 221.0 lb

## 2023-11-20 DIAGNOSIS — E66812 Obesity, class 2: Secondary | ICD-10-CM

## 2023-11-20 DIAGNOSIS — Z6837 Body mass index (BMI) 37.0-37.9, adult: Secondary | ICD-10-CM | POA: Diagnosis not present

## 2023-11-20 DIAGNOSIS — L509 Urticaria, unspecified: Secondary | ICD-10-CM

## 2023-11-20 DIAGNOSIS — Z124 Encounter for screening for malignant neoplasm of cervix: Secondary | ICD-10-CM

## 2023-11-20 DIAGNOSIS — E6609 Other obesity due to excess calories: Secondary | ICD-10-CM | POA: Diagnosis not present

## 2023-11-20 DIAGNOSIS — Z1211 Encounter for screening for malignant neoplasm of colon: Secondary | ICD-10-CM

## 2023-11-20 NOTE — Progress Notes (Signed)
 Haley Franco, CMA,acting as a Neurosurgeon for Haley Lynch, NP.,have documented all relevant documentation on the behalf of Haley Creighton, NP,as directed by  Haley Creighton, NP while in the presence of Haley Creighton, NP.  Subjective:  Patient ID: Haley Franco , female    DOB: [birth year] , 48 y.o.   MRN: [id number]  Chief Complaint  Patient presents with   Urticaria    HPI  Patient is a 48 year old female who present today for hives.She reports she was recently at the beach when she got she broke out in hives on her legs, chest and face. She has been taking allergy medications and was using itching cream.  Today she feels better no more itching. She also reports that she feels bloated , gaining weight. She feels like she is retaining water.      History reviewed. No pertinent past medical history.   Family History  Problem Relation Age of Onset   Healthy Mother    Insomnia Mother    Healthy Father    Dementia Maternal Grandmother    Alzheimer's disease Maternal Grandmother    Hypertension Maternal Grandfather    Hyperlipidemia Maternal Grandfather     No current outpatient medications on file.   No Known Allergies   Review of Systems  Constitutional: Negative.   HENT: Negative.    Gastrointestinal:  Positive for abdominal distention.  Skin:  Positive for rash.  Psychiatric/Behavioral: Negative.       Today's Vitals   11/20/23 1627  BP: 120/72  Pulse: (!) 106  Temp: 98.2 F (36.8 C)  TempSrc: Oral  Weight: 221 lb (100.2 kg)  Height: 5' 4 (1.626 m)  PainSc: 0-No pain   Body mass index is 37.93 kg/m.  Wt Readings from Last 3 Encounters:  11/20/23 221 lb (100.2 kg)  12/25/21 213 lb 3.2 oz (96.7 kg)  05/22/21 215 lb 6.4 oz (97.7 kg)    The 10-year ASCVD risk score (Arnett DK, et al., 2019) is: 0.6%   Values used to calculate the score:     Age: 36 years     Clincally relevant sex: Female     Is Non-Hispanic African American: Yes     Diabetic: No      Tobacco smoker: No     Systolic Blood Pressure: 120 mmHg     Is BP treated: No     HDL Cholesterol: 70 mg/dL     Total Cholesterol: 222 mg/dL  Objective:  Physical Exam HENT:     Head: Normocephalic.  Cardiovascular:     Rate and Rhythm: Tachycardia present.  Pulmonary:     Effort: Pulmonary effort is normal.     Breath sounds: Normal breath sounds.  Skin:    General: Skin is warm.  Neurological:     General: No focal deficit present.     Mental Status: She is alert and oriented to person, place, and time.         Assessment And Plan:  Hives Assessment & Plan: Resolved. No new outbreaks   Screening for colon cancer -     Ambulatory referral to Gastroenterology  Cervical cancer screening -     Ambulatory referral to Obstetrics / Gynecology  Class 2 obesity due to excess calories without serious comorbidity with body mass index (BMI) of 37.0 to 37.9 in adult Assessment & Plan: She is encouraged to strive for BMI less than 30 to decrease cardiac risk. Advised to aim for at least 150 minutes of  exercise per week.      Return in 1 month (on 12/21/2023), or if symptoms worsen or fail to improve, for physical.  Patient was given opportunity to ask questions. Patient verbalized understanding of the plan and was able to repeat key elements of the plan. All questions were answered to their satisfaction.    I, Haley Creighton, NP, have reviewed all documentation for this visit. The documentation on 11/26/2023 for the exam, diagnosis, procedures, and orders are all accurate and complete.    IF YOU HAVE BEEN REFERRED TO A SPECIALIST, IT MAY TAKE 1-2 WEEKS TO SCHEDULE/PROCESS THE REFERRAL. IF YOU HAVE NOT HEARD FROM US /SPECIALIST IN TWO WEEKS, PLEASE GIVE US  A CALL AT [phone].

## 2023-11-26 DIAGNOSIS — Z124 Encounter for screening for malignant neoplasm of cervix: Secondary | ICD-10-CM | POA: Insufficient documentation

## 2023-11-26 DIAGNOSIS — E66812 Obesity, class 2: Secondary | ICD-10-CM | POA: Insufficient documentation

## 2023-11-26 DIAGNOSIS — L509 Urticaria, unspecified: Secondary | ICD-10-CM | POA: Insufficient documentation

## 2023-11-26 DIAGNOSIS — E6609 Other obesity due to excess calories: Secondary | ICD-10-CM | POA: Insufficient documentation

## 2023-11-26 DIAGNOSIS — Z1211 Encounter for screening for malignant neoplasm of colon: Secondary | ICD-10-CM | POA: Insufficient documentation

## 2023-11-26 NOTE — Assessment & Plan Note (Signed)
 She is encouraged to strive for BMI less than 30 to decrease cardiac risk. Advised to aim for at least 150 minutes of exercise per week.

## 2023-11-26 NOTE — Assessment & Plan Note (Signed)
 Resolved. No new outbreaks

## 2023-12-13 ENCOUNTER — Ambulatory Visit (INDEPENDENT_AMBULATORY_CARE_PROVIDER_SITE_OTHER): Admitting: Family Medicine

## 2023-12-13 ENCOUNTER — Other Ambulatory Visit (HOSPITAL_COMMUNITY)
Admission: RE | Admit: 2023-12-13 | Discharge: 2023-12-13 | Disposition: A | Discharge status: Home / Self Care | Source: Ambulatory Visit | Attending: Family Medicine | Admitting: Family Medicine

## 2023-12-13 ENCOUNTER — Encounter: Payer: Self-pay | Admitting: Family Medicine

## 2023-12-13 VITALS — BP 120/72 | HR 90 | Temp 98.4°F | Ht 64.0 in | Wt 218.0 lb

## 2023-12-13 DIAGNOSIS — Z124 Encounter for screening for malignant neoplasm of cervix: Secondary | ICD-10-CM | POA: Insufficient documentation

## 2023-12-13 DIAGNOSIS — Z113 Encounter for screening for infections with a predominantly sexual mode of transmission: Secondary | ICD-10-CM

## 2023-12-13 DIAGNOSIS — Z0000 Encounter for general adult medical examination without abnormal findings: Secondary | ICD-10-CM

## 2023-12-13 DIAGNOSIS — E66812 Obesity, class 2: Secondary | ICD-10-CM

## 2023-12-13 DIAGNOSIS — R7309 Other abnormal glucose: Secondary | ICD-10-CM | POA: Insufficient documentation

## 2023-12-13 DIAGNOSIS — Z1231 Encounter for screening mammogram for malignant neoplasm of breast: Secondary | ICD-10-CM

## 2023-12-13 DIAGNOSIS — E7800 Pure hypercholesterolemia, unspecified: Secondary | ICD-10-CM

## 2023-12-13 DIAGNOSIS — Z6837 Body mass index (BMI) 37.0-37.9, adult: Secondary | ICD-10-CM | POA: Diagnosis not present

## 2023-12-13 DIAGNOSIS — Z1211 Encounter for screening for malignant neoplasm of colon: Secondary | ICD-10-CM

## 2023-12-13 DIAGNOSIS — Z114 Encounter for screening for human immunodeficiency virus [HIV]: Secondary | ICD-10-CM

## 2023-12-13 NOTE — Patient Instructions (Signed)

## 2023-12-13 NOTE — Progress Notes (Signed)
 I,[name], CMA,acting as a Neurosurgeon for [employer], NP.,have documented all relevant documentation on the behalf of Bruna Creighton, NP,as directed by  Bruna Creighton, NP while in the presence of Bruna Creighton, NP.  Subjective:    Patient ID: Haley Franco , female    DOB: 1975/10/14 , 48 y.o.   MRN: [id number]  Chief Complaint  Patient presents with   Annual Exam    Patient presents today for a physical. Patient doesn't have any questions or concerns at this time.     HPI Discussed the use of AI scribe software for clinical note transcription with the patient, who gave verbal consent to proceed.  History of Present Illness     Haley Franco is a 48 year old female who presents for an annual physical exam and Pap smear.  She is undergoing fasting blood work to evaluate her A1c, cholesterol levels, kidney function, liver function, and blood count. Her last A1c test was in 2023, and her cholesterol was elevated at that time. She is due for a mammogram, as her last one was in 2023. She is also considering STD testing as she is dating again.  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 In 2018, she was informed of a small fibroid, about the size of the tip of her pinky, but she is unsure if it contributed to her miscarriages.  She has been single for a couple of years since her marriage ended and is now in the early stages of a new relationship. She wants to ensure her health is in order as she considers becoming sexually active again.  No chest pain, but sometimes feels pressure in her lymph nodes. She performs self-breast exams and has noticed weight gain, which she attributes to an increase in bra size. No nipple discharge.  She has not had a menstrual cycle since earlier this year and suspects she may be entering perimenopause or menopause. No bleeding in her stool and her last menstrual cycle was in the spring.       History reviewed. No pertinent past medical history.   Family History  Problem Relation Age of Onset   Healthy Mother    Insomnia Mother    Healthy Father    Dementia Maternal Grandmother    Alzheimer's disease Maternal Grandmother    Hypertension Maternal Grandfather    Hyperlipidemia Maternal Grandfather     No current outpatient medications on file.   No Known Allergies     Social History   Tobacco Use  Smoking Status Never   Passive exposure: Never  Smokeless Tobacco Never   Social History   Substance and Sexual Activity  Alcohol Use Yes   Comment: socially    Review of Systems  Constitutional: Negative.   HENT: Negative.    Eyes: Negative.   Respiratory: Negative.    Cardiovascular: Negative.   Gastrointestinal: Negative.   Endocrine: Negative.   Genitourinary: Negative.   Musculoskeletal: Negative.   Skin: Negative.   Allergic/Immunologic: Negative.   Neurological: Negative.   Hematological: Negative.   Psychiatric/Behavioral: Negative.       Today's Vitals   12/13/23 0946  BP: 120/72  Pulse: 90  Temp: 98.4 F (36.9 C)  TempSrc: Oral  Weight: 218 lb (98.9 kg)  Height: 5' 4 (1.626 m)  PainSc: 0-No pain   Body mass index is 37.42 kg/m.  Wt Readings from Last 3 Encounters:  12/13/23 218 lb (  98.9 kg)  11/20/23 221 lb (100.2 kg)  12/25/21 213 lb 3.2 oz (96.7 kg)     Objective:  Physical Exam Exam conducted with a chaperone present.  Constitutional:      Appearance: Normal appearance.  HENT:     Head: Normocephalic.  Cardiovascular:     Rate and Rhythm: Normal rate and regular rhythm.     Pulses: Normal pulses.     Heart sounds: Normal heart sounds.  Pulmonary:     Effort: Pulmonary effort is normal.     Breath sounds: Normal breath sounds.  Chest:  Breasts:    Tanner Score is 5.     Right: Normal. No swelling, inverted nipple, mass or nipple discharge.     Left: Normal. No swelling, inverted nipple, mass or nipple discharge.   Abdominal:     General: Bowel sounds are normal.  Genitourinary:    General: Normal vulva.     Exam position: Lithotomy position.     Labia:        Right: No rash, tenderness or lesion.        Left: No rash, tenderness or lesion.      Rectum: Normal. Guaiac result negative.     Comments: Pap smear done and procedure well tolerated in the presence of a chaperone. No tenderness noted on palpation, no lesions or rashes noted. Scant discharge observed and no bleeding seen.  Musculoskeletal:        General: Normal range of motion.  Skin:    General: Skin is warm and dry.  Neurological:     General: No focal deficit present.     Mental Status: She is alert and oriented to person, place, and time. Mental status is at baseline.  Psychiatric:        Mood and Affect: Mood normal.         Assessment And Plan:     Encounter for general adult medical examination w/o abnormal findings -     CBC -     CMP14+EGFR  Hypercholesteremia -     Lipid panel  Cervical cancer screening -     Cytology - PAP  Screening for colon cancer -     Ambulatory referral to Gastroenterology  Screening mammogram for breast cancer -     3D Screening Mammogram, Left and Right; Future  Encounter for screening for HIV -     HIV Antibody (routine testing w rflx)  Routine screening for STI (sexually transmitted infection) -     RPR  Class 2 severe obesity due to excess calories with serious comorbidity and body mass index (BMI) of 37.0 to 37.9 in adult [facility]) Assessment & Plan: She is encouraged to strive for BMI less than 30 to decrease cardiac risk. Advised to aim for at least 150 minutes of exercise per week.      Assessment and Plan Assessment & Plan Adult Wellness Visit Routine wellness visit. She is due for mammogram and colonoscopy. Considering becoming sexually active again. - Perform Pap smear. - Order blood work: A1c, cholesterol, kidney and liver function, blood count. - Discuss colon  cancer screening; refer for colonoscopy. - Discuss mammogram screening. - Perform STD testing including HIV. - Educate on self-breast examination.  Hemorrhoids No symptoms or evidence of hemorrhoids.  Class 2 obesity due to excess calories Class 2 obesity discussed with emphasis on weight impact on health and importance of management.   Return for 1 year physical. Patient was given opportunity to ask questions. Patient  verbalized understanding of the plan and was able to repeat key elements of the plan. All questions were answered to their satisfaction.  I, Bruna Creighton, NP, have reviewed all documentation for this visit. The documentation on 12/17/2023 for the exam, diagnosis, procedures, and orders are all accurate and complete.

## 2023-12-14 LAB — CBC
Hematocrit: 41.8 % (ref 34.0–46.6)
Hemoglobin: 13.3 g/dL (ref 11.1–15.9)
MCH: 28.2 pg (ref 26.6–33.0)
MCHC: 31.8 g/dL (ref 31.5–35.7)
MCV: 89 fL (ref 79–97)
Platelets: 175 x10E3/uL (ref 150–450)
RBC: 4.71 x10E6/uL (ref 3.77–5.28)
RDW: 13.4 % (ref 11.7–15.4)
WBC: 4.4 x10E3/uL (ref 3.4–10.8)

## 2023-12-14 LAB — CMP14+EGFR
ALT: 23 IU/L (ref 0–32)
AST: 20 IU/L (ref 0–40)
Albumin: 4.5 g/dL (ref 3.9–4.9)
Alkaline Phosphatase: 67 IU/L (ref 44–121)
BUN/Creatinine Ratio: 16 (ref 9–23)
BUN: 14 mg/dL (ref 6–24)
Bilirubin Total: 0.5 mg/dL (ref 0.0–1.2)
CO2: 22 mmol/L (ref 20–29)
Calcium: 9.4 mg/dL (ref 8.7–10.2)
Chloride: 101 mmol/L (ref 96–106)
Creatinine, Ser: 0.86 mg/dL (ref 0.57–1.00)
Globulin, Total: 2.3 g/dL (ref 1.5–4.5)
Glucose: 88 mg/dL (ref 70–99)
Potassium: 4 mmol/L (ref 3.5–5.2)
Sodium: 136 mmol/L (ref 134–144)
Total Protein: 6.8 g/dL (ref 6.0–8.5)
eGFR: 85 mL/min/1.73 (ref >59)

## 2023-12-14 LAB — LIPID PANEL
Chol/HDL Ratio: 3.1 ratio (ref 0.0–4.4)
Cholesterol, Total: 239 mg/dL — ABNORMAL HIGH (ref 100–199)
HDL: 77 mg/dL (ref >39)
LDL Chol Calc (NIH): 144 mg/dL — ABNORMAL HIGH (ref 0–99)
Triglycerides: 103 mg/dL (ref 0–149)
VLDL Cholesterol Cal: 18 mg/dL (ref 5–40)

## 2023-12-14 LAB — RPR: RPR Ser Ql: NONREACTIVE

## 2023-12-14 LAB — HIV ANTIBODY (ROUTINE TESTING W REFLEX): HIV Screen 4th Generation wRfx: NONREACTIVE

## 2023-12-18 ENCOUNTER — Ambulatory Visit: Payer: Self-pay | Admitting: Family Medicine

## 2023-12-18 DIAGNOSIS — B977 Papillomavirus as the cause of diseases classified elsewhere: Secondary | ICD-10-CM

## 2023-12-18 DIAGNOSIS — Z113 Encounter for screening for infections with a predominantly sexual mode of transmission: Secondary | ICD-10-CM | POA: Insufficient documentation

## 2023-12-18 MED ORDER — ROSUVASTATIN CALCIUM 5 MG PO TABS: 5.0000 mg | ORAL_TABLET | Freq: Every day | ORAL | 2 refills | Status: AC

## 2023-12-18 NOTE — Assessment & Plan Note (Signed)
 She is encouraged to strive for BMI less than 30 to decrease cardiac risk. Advised to aim for at least 150 minutes of exercise per week.

## 2023-12-18 NOTE — Progress Notes (Signed)
 All labs came back normal except your cholesterol levels,it has gone higher with a few points. I will start you on a low dose statin for cholesterol. Take once daily also low fat diet and exercise is advised.  The result for pap smear is not out yet.  Thank you!

## 2023-12-19 LAB — CYTOLOGY - PAP
Adequacy: ABSENT
Chlamydia: NEGATIVE
Comment: NEGATIVE
Comment: NEGATIVE
Comment: NEGATIVE
Comment: NORMAL
Diagnosis: REACTIVE
High risk HPV: POSITIVE — AB
Neisseria Gonorrhea: NEGATIVE
Trichomonas: NEGATIVE

## 2023-12-20 ENCOUNTER — Other Ambulatory Visit: Payer: Self-pay

## 2023-12-20 ENCOUNTER — Telehealth: Payer: Self-pay

## 2023-12-20 DIAGNOSIS — Z1211 Encounter for screening for malignant neoplasm of colon: Secondary | ICD-10-CM

## 2023-12-20 MED ORDER — NA SULFATE-K SULFATE-MG SULF 17.5-3.13-1.6 GM/177ML PO SOLN
1.0000 | Freq: Once | ORAL | 0 refills | Status: AC
Start: 2023-12-20 — End: 2023-12-20

## 2023-12-20 NOTE — Telephone Encounter (Signed)
 Gastroenterology Pre-Procedure Review  Request Date: 03/31/24 Requesting Physician: Dr. Jinny  PATIENT REVIEW QUESTIONS: The patient responded to the following health history questions as indicated:    1. Are you having any GI issues? no 2. Do you have a personal history of Polyps? no 3. Do you have a family history of Colon Cancer or Polyps? no 4. Diabetes Mellitus? no 5. Joint replacements in the past 12 months?no 6. Major health problems in the past 3 months?no 7. Any artificial heart valves, MVP, or defibrillator?no    MEDICATIONS & ALLERGIES:    Patient reports the following regarding taking any anticoagulation/antiplatelet therapy:   Plavix, Coumadin, Eliquis, Xarelto, Lovenox, Pradaxa, Brilinta, or Effient? no Aspirin? no  Patient confirms/reports the following medications:  Current Outpatient Medications  Medication Sig Dispense Refill   rosuvastatin  (CRESTOR ) 5 MG tablet Take 1 tablet (5 mg total) by mouth daily. 90 tablet 2   No current facility-administered medications for this visit.    Patient confirms/reports the following allergies:  No Known Allergies  No orders of the defined types were placed in this encounter.   AUTHORIZATION INFORMATION Primary Insurance: 1D#: Group #:  Secondary Insurance: 1D#: Group #:  SCHEDULE INFORMATION: Date: 03/31/24 Time: Location: [facility]

## 2023-12-26 NOTE — Progress Notes (Signed)
 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 I have refferred you to the GYN so they can decide the next steps for evaluation/treatment.  Thanks

## 2024-03-23 ENCOUNTER — Other Ambulatory Visit (HOSPITAL_COMMUNITY)
Admission: RE | Admit: 2024-03-23 | Discharge: 2024-03-23 | Disposition: A | Discharge status: Home / Self Care | Source: Ambulatory Visit | Attending: Obstetrics and Gynecology | Admitting: Obstetrics and Gynecology

## 2024-03-23 ENCOUNTER — Ambulatory Visit: Admitting: Obstetrics and Gynecology

## 2024-03-23 VITALS — BP 128/82 | HR 80 | Wt 220.0 lb

## 2024-03-23 DIAGNOSIS — Z124 Encounter for screening for malignant neoplasm of cervix: Secondary | ICD-10-CM

## 2024-03-23 DIAGNOSIS — Z8742 Personal history of other diseases of the female genital tract: Secondary | ICD-10-CM | POA: Diagnosis present

## 2024-03-23 DIAGNOSIS — R87810 Cervical high risk human papillomavirus (HPV) DNA test positive: Secondary | ICD-10-CM

## 2024-03-23 NOTE — Progress Notes (Signed)
 Patient presents for Consult of abnormal pap.  LMP: 03/11/24 notes being irregular this year. Last pap: 12/13/2023 +HR HPV  Contraception: None Mammogram: 06/08/2021

## 2024-03-23 NOTE — Progress Notes (Signed)
   NEW GYNECOLOGY VISIT  Subjective:  Haley Franco is a 48 y.o. with Patient's last menstrual period was 03/11/2024. 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 Here to discuss next steps.  Worried about when/how HPV was contracted and future risks. Last pap was ~2022 and was normal but she does not have records of this. Hx abnormal pap in college but does not recall needing biopsies or additional procedures.   Objective:   Vitals:   03/23/24 1436  BP: 128/82  Pulse: 80  Weight: 220 lb (99.8 kg)   General:  Alert, oriented and cooperative. Patient is in no acute distress.  Skin: Skin is warm and dry. No rash noted.   Cardiovascular: Normal heart rate noted  Respiratory: Normal respiratory effort, no problems with respiration noted  Pelvic: NEFG. Cervix visually normal, no masses or tenderness  Exam performed in the presence of a chaperone  Assessment and Plan:  Haley Franco is a 48 y.o. with recent HPV+ pap  1. Cervical cancer screening (Primary) 2. History of abnormal cervical Pap smear For benign pap with untyped +HPV, ASCCP recommends repeat pap in 1 year. She is concerned about this option and doesn't have records of previously normal paps.  If she were to be HPV 16/18+, a colposcopy would be recommended. TZ was also absent with her pap. In light of this, I think it is reasonable to repeat pap with HPV genotyping and offered this to Haley Franco. She would like to move forward with this option.  Reviewed potential outcomes. If +HPV but not 16/18 OR HPV negative, repeat pap in 1 year. If +HPV 16/18 OR abnormal pap, would plan for colposcopy with biopsies.  - Cytology - PAP  Total encounter time: 30 minutes  Future Appointments  Date Time Provider Department Center  12/21/2024  9:20 AM Haley Speaks, FNP [facility] [id number] JAYSON Erik, MD

## 2024-03-26 LAB — CYTOLOGY - PAP
Adequacy: ABSENT
Comment: NEGATIVE
Comment: NEGATIVE
Comment: NEGATIVE
Diagnosis: NEGATIVE
HPV 16: NEGATIVE
HPV 18 / 45: NEGATIVE
High risk HPV: POSITIVE — AB

## 2024-03-30 ENCOUNTER — Ambulatory Visit: Payer: Self-pay | Admitting: Obstetrics and Gynecology

## 2024-03-31 ENCOUNTER — Ambulatory Visit: Admitting: Anesthesiology

## 2024-03-31 ENCOUNTER — Ambulatory Visit
Admission: RE | Admit: 2024-03-31 | Discharge: 2024-03-31 | Disposition: A | Discharge status: Home / Self Care | Attending: Gastroenterology | Admitting: Gastroenterology

## 2024-03-31 ENCOUNTER — Encounter: Payer: Self-pay | Admitting: Gastroenterology

## 2024-03-31 ENCOUNTER — Encounter
Admission: RE | Disposition: A | Discharge status: Home / Self Care | Payer: Self-pay | Source: Home / Self Care | Attending: Gastroenterology

## 2024-03-31 ENCOUNTER — Other Ambulatory Visit: Payer: Self-pay

## 2024-03-31 DIAGNOSIS — Z1211 Encounter for screening for malignant neoplasm of colon: Secondary | ICD-10-CM | POA: Insufficient documentation

## 2024-03-31 HISTORY — PX: COLONOSCOPY: SHX5424

## 2024-03-31 LAB — POCT PREGNANCY, URINE: Preg Test, Ur: NEGATIVE

## 2024-03-31 SURGERY — COLONOSCOPY
Anesthesia: General

## 2024-03-31 MED ORDER — LIDOCAINE HCL (CARDIAC) PF 100 MG/5ML IV SOSY
PREFILLED_SYRINGE | INTRAVENOUS | Status: DC | PRN
  Administered 2024-03-31: 60 mg via INTRAVENOUS

## 2024-03-31 MED ORDER — EPHEDRINE 5 MG/ML INJ
INTRAVENOUS | Status: AC
  Filled 2024-03-31: qty 5

## 2024-03-31 MED ORDER — PROPOFOL 500 MG/50ML IV EMUL
INTRAVENOUS | Status: DC | PRN
Start: 2024-03-31 — End: 2024-03-31
  Administered 2024-03-31: 75 ug/kg/min via INTRAVENOUS

## 2024-03-31 MED ORDER — PROPOFOL 10 MG/ML IV BOLUS
INTRAVENOUS | Status: DC | PRN
  Administered 2024-03-31 (×2): 50 mg via INTRAVENOUS

## 2024-03-31 MED ORDER — LIDOCAINE HCL (PF) 2 % IJ SOLN
INTRAMUSCULAR | Status: AC
Start: 2024-03-31 — End: 2024-03-31
  Filled 2024-03-31: qty 5

## 2024-03-31 MED ORDER — DEXMEDETOMIDINE HCL IN NACL 80 MCG/20ML IV SOLN
INTRAVENOUS | Status: DC | PRN
  Administered 2024-03-31: 12 ug via INTRAVENOUS
  Administered 2024-03-31: 8 ug via INTRAVENOUS

## 2024-03-31 MED ORDER — SODIUM CHLORIDE 0.9 % IV SOLN: INTRAVENOUS | Status: DC

## 2024-03-31 NOTE — H&P (Signed)
   Rogelia Copping, MD [facility] [address], [address] [place], [place] [address] Phone: [phone] Fax : [phone]  Primary Care Physician:  Georgina Speaks, FNP Primary Gastroenterologist:  Dr. Copping  Pre-Procedure History & Physical: HPI:  Haley Franco is a 48 y.o. female is here for a screening colonoscopy.   No past medical history on file.  No past surgical history on file.  Prior to Admission medications   Medication Sig Start Date End Date Taking? Authorizing Provider  rosuvastatin  (CRESTOR ) 5 MG tablet Take 1 tablet (5 mg total) by mouth daily. 12/18/23 12/17/24 Yes Petrina Pries, NP    Allergies as of 12/20/2023   (No Known Allergies)    Family History  Problem Relation Age of Onset   Healthy Mother    Insomnia Mother    Healthy Father    Dementia Maternal Grandmother    Alzheimer's disease Maternal Grandmother    Hypertension Maternal Grandfather    Hyperlipidemia Maternal Grandfather     Social History   Socioeconomic History   Marital status: Single    Spouse name: Not on file   Number of children: Not on file   Years of education: Not on file   Highest education level: Not on file  Occupational History   Not on file  Tobacco Use   Smoking status: Never    Passive exposure: Never   Smokeless tobacco: Never  Substance and Sexual Activity   Alcohol use: Yes    Comment: socially   Drug use: Never   Sexual activity: Not on file  Other Topics Concern   Not on file  Social History Narrative   Not on file   Social Drivers of Health   Financial Resource Strain: Not on file  Food Insecurity: Not on file  Transportation Needs: Not on file  Physical Activity: Not on file  Stress: Not on file  Social Connections: Not on file  Intimate Partner Violence: Not on file    Review of Systems: See HPI, otherwise negative ROS  Physical Exam: There were no vitals taken for this visit. General:   Alert,  pleasant and cooperative in NAD Head:   Normocephalic and atraumatic. Neck:  Supple; no masses or thyromegaly. Lungs:  Clear throughout to auscultation.    Heart:  Regular rate and rhythm. Abdomen:  Soft, nontender and nondistended. Normal bowel sounds, without guarding, and without rebound.   Neurologic:  Alert and  oriented x4;  grossly normal neurologically.  Impression/Plan: Haley Franco is now here to undergo a screening colonoscopy.  Risks, benefits, and alternatives regarding colonoscopy have been reviewed with the patient.  Questions have been answered.  All parties agreeable.

## 2024-03-31 NOTE — Transfer of Care (Signed)
 Immediate Anesthesia Transfer of Care Note  Patient: Haley Franco  Procedure(s) Performed: COLONOSCOPY  Patient Location: PACU  Anesthesia Type:General  Level of Consciousness: sedated  Airway & Oxygen Therapy: Patient Spontanous Breathing  Post-op Assessment: Report given to RN and Post -op Vital signs reviewed and stable  Post vital signs: Reviewed and stable  Last Vitals:  Vitals Value Taken Time  BP 100/70 03/31/24 09:53  Temp    Pulse 87 03/31/24 09:53  Resp 13 03/31/24 09:53  SpO2 100 % 03/31/24 09:53  Vitals shown include unfiled device data.  Last Pain:  Vitals:   03/31/24 0917  TempSrc: Tympanic         Complications: No notable events documented.

## 2024-03-31 NOTE — Anesthesia Preprocedure Evaluation (Addendum)
 Anesthesia Evaluation  Patient identified by MRN, date of birth, ID band Patient awake    Reviewed: Allergy & Precautions, H&P , NPO status , Patient's Chart, lab work & pertinent test results  Airway Mallampati: II  TM Distance: >3 FB     Dental   Pulmonary neg pulmonary ROS   Pulmonary exam normal        Cardiovascular negative cardio ROS Normal cardiovascular exam Rhythm:Regular     Neuro/Psych negative neurological ROS  negative psych ROS   GI/Hepatic negative GI ROS, Neg liver ROS,,,  Endo/Other  negative endocrine ROS    Renal/GU negative Renal ROS  negative genitourinary   Musculoskeletal negative musculoskeletal ROS (+)    Abdominal   Peds negative pediatric ROS (+)  Hematology negative hematology ROS (+)   Anesthesia Other Findings   Reproductive/Obstetrics negative OB ROS                              Anesthesia Physical Anesthesia Plan  ASA: 1  Anesthesia Plan: General   Post-op Pain Management:    Induction:   PONV Risk Score and Plan: TIVA and Propofol infusion  Airway Management Planned: Natural Airway and Nasal Cannula  Additional Equipment:   Intra-op Plan:   Post-operative Plan:   Informed Consent: I have reviewed the patients History and Physical, chart, labs and discussed the procedure including the risks, benefits and alternatives for the proposed anesthesia with the patient or authorized representative who has indicated his/her understanding and acceptance.       Plan Discussed with: CRNA  Anesthesia Plan Comments: (IVGA with LMA as backup)         Anesthesia Quick Evaluation

## 2024-03-31 NOTE — Op Note (Signed)
 [facility] Patient Name: Haley Franco Procedure Date: 03/31/2024 9:34 AM MRN: [id number] Account #: [id number] Date of Birth: Aug 29, 1975 Admit Type: Outpatient Age: 48 Room: [facility] ENDO ROOM 4 Gender: Female Note Status: Finalized Instrument Name: Colon Scope [id number] Procedure:             Colonoscopy Indications:           Screening for colorectal malignant neoplasm Providers:             Rogelia Copping MD, MD Referring MD:          Gaines Ada (Referring MD) Medicines:             Propofol per Anesthesia Complications:         No immediate complications. Procedure:             Pre-Anesthesia Assessment:                        - Prior to the procedure, a History and Physical was                         performed, and patient medications and allergies were                         reviewed. The patient's tolerance of previous                         anesthesia was also reviewed. The risks and benefits                         of the procedure and the sedation options and risks                         were discussed with the patient. All questions were                         answered, and informed consent was obtained. Prior                         Anticoagulants: The patient has taken no anticoagulant                         or antiplatelet agents. ASA Grade Assessment: II - A                         patient with mild systemic disease. After reviewing                         the risks and benefits, the patient was deemed in                         satisfactory condition to undergo the procedure.                        After obtaining informed consent, the colonoscope was                         passed under direct vision. Throughout the procedure,  the patient's blood pressure, pulse, and oxygen                         saturations were monitored continuously. The                         Colonoscope was introduced through  the anus and                         advanced to the the cecum, identified by appendiceal                         orifice and ileocecal valve. The colonoscopy was                         performed without difficulty. The patient tolerated                         the procedure well. The quality of the bowel                         preparation was excellent. Findings:      The perianal and digital rectal examinations were normal.      The colon (entire examined portion) appeared normal. Impression:            - The entire examined colon is normal.                        - No specimens collected. Recommendation:        - Discharge patient to home.                        - Resume previous diet.                        - Continue present medications. Procedure Code(s):     --- Professional ---                        [id number], Colonoscopy, flexible; diagnostic, including                         collection of specimen(s) by brushing or washing, when                         performed (separate procedure) Diagnosis Code(s):     --- Professional ---                        Z12.11, Encounter for screening for malignant neoplasm                         of colon CPT copyright 2022 American [employer] Association. All rights reserved. The codes documented in this report are preliminary and upon coder review may  be revised to meet current compliance requirements. Rogelia Copping MD, MD 03/31/2024 9:50:39 AM This report has been signed electronically. Number of Addenda: 0 Note Initiated On: 03/31/2024 9:34 AM Scope Withdrawal Time: 0 hours 7 minutes 2 seconds  Total Procedure Duration: 0 hours 9 minutes 19 seconds  Estimated Blood Loss:  Estimated blood loss:  none.      [facility]

## 2024-03-31 NOTE — Anesthesia Postprocedure Evaluation (Signed)
 Anesthesia Post Note  Patient: Haley Franco  Procedure(s) Performed: COLONOSCOPY  Patient location during evaluation: PACU Anesthesia Type: General Level of consciousness: awake and alert Pain management: pain level controlled Vital Signs Assessment: post-procedure vital signs reviewed and stable Respiratory status: spontaneous breathing, nonlabored ventilation, respiratory function stable and patient connected to nasal cannula oxygen Cardiovascular status: blood pressure returned to baseline and stable Postop Assessment: no apparent nausea or vomiting Anesthetic complications: no   No notable events documented.   Last Vitals:  Vitals:   03/31/24 1000 03/31/24 1007  BP:  114/77  Pulse: 84 84  Resp:  16  Temp:    SpO2: 99% 100%    Last Pain:  Vitals:   03/31/24 0917  TempSrc: Tympanic                 Shau-Shau Melia

## 2024-12-21 ENCOUNTER — Encounter: Payer: Self-pay | Admitting: Nurse Practitioner
